# Patient Record
Sex: Female | Born: 1980 | Race: White | Hispanic: No | Marital: Married | Smoking: Former smoker
Health system: Southern US, Community
[De-identification: ages and names within clinical notes are randomized; demographics above are authoritative.]

## PROBLEM LIST (undated history)

## (undated) DIAGNOSIS — B373 Candidiasis of vulva and vagina: Secondary | ICD-10-CM

## (undated) DIAGNOSIS — F32A Depression, unspecified: Secondary | ICD-10-CM

## (undated) DIAGNOSIS — F329 Major depressive disorder, single episode, unspecified: Secondary | ICD-10-CM

## (undated) DIAGNOSIS — B3731 Acute candidiasis of vulva and vagina: Secondary | ICD-10-CM

## (undated) DIAGNOSIS — I10 Essential (primary) hypertension: Secondary | ICD-10-CM

## (undated) DIAGNOSIS — F419 Anxiety disorder, unspecified: Secondary | ICD-10-CM

## (undated) DIAGNOSIS — Z8619 Personal history of other infectious and parasitic diseases: Secondary | ICD-10-CM

## (undated) DIAGNOSIS — K921 Melena: Secondary | ICD-10-CM

## (undated) HISTORY — DX: Personal history of other infectious and parasitic diseases: Z86.19

## (undated) HISTORY — DX: Candidiasis of vulva and vagina: B37.3

## (undated) HISTORY — DX: Acute candidiasis of vulva and vagina: B37.31

## (undated) HISTORY — DX: Essential (primary) hypertension: I10

## (undated) HISTORY — DX: Melena: K92.1

---
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
## 2003-02-09 ENCOUNTER — Encounter: Payer: Self-pay | Admitting: Emergency Medicine

## 2003-02-09 ENCOUNTER — Emergency Department (HOSPITAL_COMMUNITY): Admission: EM | Admit: 2003-02-09 | Discharge: 2003-02-09 | Payer: Self-pay | Admitting: Emergency Medicine

## 2006-08-27 ENCOUNTER — Ambulatory Visit (HOSPITAL_COMMUNITY): Admission: RE | Admit: 2006-08-27 | Discharge: 2006-08-27 | Payer: Self-pay | Admitting: Internal Medicine

## 2006-09-17 ENCOUNTER — Ambulatory Visit (HOSPITAL_COMMUNITY): Admission: RE | Admit: 2006-09-17 | Discharge: 2006-09-17 | Payer: Self-pay | Admitting: Internal Medicine

## 2010-09-10 ENCOUNTER — Encounter: Payer: Self-pay | Admitting: Internal Medicine

## 2010-11-19 DIAGNOSIS — K921 Melena: Secondary | ICD-10-CM

## 2010-11-19 HISTORY — DX: Melena: K92.1

## 2011-12-05 ENCOUNTER — Ambulatory Visit: Payer: Self-pay | Admitting: Obstetrics and Gynecology

## 2011-12-17 ENCOUNTER — Telehealth: Payer: Self-pay

## 2011-12-17 NOTE — Telephone Encounter (Signed)
Per protocol, Sprintec 28 day , sig: 1 po qd. 1 RF called to pharmacy til pt's AEX in May. Melody Comas A

## 2011-12-18 ENCOUNTER — Ambulatory Visit: Payer: Self-pay | Admitting: Obstetrics and Gynecology

## 2012-01-09 ENCOUNTER — Ambulatory Visit: Payer: Self-pay | Admitting: Obstetrics and Gynecology

## 2012-01-15 ENCOUNTER — Other Ambulatory Visit: Payer: Self-pay

## 2012-01-15 DIAGNOSIS — Z309 Encounter for contraceptive management, unspecified: Secondary | ICD-10-CM

## 2012-01-15 NOTE — Telephone Encounter (Signed)
Faxed in a refill on sprintec 28 day tabs sig 1 po qd  Disp. 1 pk only until after AEX in June 2013. Melody Comas A

## 2012-01-23 MED ORDER — NORGESTIMATE-ETH ESTRADIOL 0.25-35 MG-MCG PO TABS: 1.0000 | ORAL_TABLET | Freq: Every day | ORAL | Status: DC

## 2012-01-29 ENCOUNTER — Ambulatory Visit (INDEPENDENT_AMBULATORY_CARE_PROVIDER_SITE_OTHER): Payer: BC Managed Care – PPO | Admitting: Obstetrics and Gynecology

## 2012-01-29 ENCOUNTER — Encounter: Payer: Self-pay | Admitting: Obstetrics and Gynecology

## 2012-01-29 VITALS — BP 134/84 | Ht 64.0 in | Wt 138.0 lb

## 2012-01-29 DIAGNOSIS — Z01419 Encounter for gynecological examination (general) (routine) without abnormal findings: Secondary | ICD-10-CM

## 2012-01-29 DIAGNOSIS — Z124 Encounter for screening for malignant neoplasm of cervix: Secondary | ICD-10-CM

## 2012-01-29 MED ORDER — NORGESTIMATE-ETH ESTRADIOL 0.25-35 MG-MCG PO TABS: 1.0000 | ORAL_TABLET | Freq: Every day | ORAL | Status: DC

## 2012-01-29 NOTE — Progress Notes (Signed)
Contraception Sprintec Last pap 11/28/2010 wnl Last Mammo none Last Colonoscopy none Last Dexa Scan none Primary MD Robley Fries Abuse at Home none  No complaints.  Pt does not report any probs with stools.  Filed Vitals:   01/29/12 1458  BP: 134/84   ROS: noncontributory  Physical Examination: General appearance - alert, well appearing, and in no distress Neck - supple, no significant adenopathy Chest - clear to auscultation, no wheezes, rales or rhonchi, symmetric air entry Heart - normal rate and regular rhythm Abdomen - soft, nontender, nondistended, no masses or organomegaly Breasts - breasts appear normal, no suspicious masses, no skin or nipple changes or axillary nodes Pelvic - normal external genitalia, vulva, vagina, cervix, uterus and adnexa Back exam - no CVAT Extremities - no edema, redness or tenderness in the calves or thighs  A/P Refill Sprintec per pt request Pap  RTO for AEX

## 2012-01-29 NOTE — Progress Notes (Signed)
Addended by: Osborn Coho on: 01/29/2012 03:53 PM   Modules accepted: Orders

## 2012-01-30 LAB — PAP IG W/ RFLX HPV ASCU

## 2016-03-29 LAB — OB RESULTS CONSOLE HIV ANTIBODY (ROUTINE TESTING): HIV: NONREACTIVE

## 2016-03-29 LAB — OB RESULTS CONSOLE RPR: RPR: NONREACTIVE

## 2016-03-29 LAB — OB RESULTS CONSOLE HEPATITIS B SURFACE ANTIGEN: Hepatitis B Surface Ag: NEGATIVE

## 2016-03-29 LAB — OB RESULTS CONSOLE ABO/RH: RH Type: POSITIVE

## 2016-03-29 LAB — OB RESULTS CONSOLE ANTIBODY SCREEN: Antibody Screen: NEGATIVE

## 2016-03-29 LAB — OB RESULTS CONSOLE GC/CHLAMYDIA
Chlamydia: NEGATIVE
Gonorrhea: NEGATIVE

## 2016-03-29 LAB — OB RESULTS CONSOLE RUBELLA ANTIBODY, IGM: Rubella: IMMUNE

## 2016-08-20 NOTE — L&D Delivery Note (Signed)
Delivery Note At 3:27 AM a viable female was delivered via Vaginal, Spontaneous Delivery (Presentation: vtx; LOA ).  APGAR: 9, 9; weight  pending.   Placenta status: spontaneous, intact.  Cord:  with the following complications: none.   Anesthesia:  Local Episiotomy: Median Lacerations: None Suture Repair: 3.0 vicryl rapide Est. Blood Loss (mL):  200  Mom to postpartum.  Baby to Couplet care / Skin to Skin.  Ann Parks D 10/16/2016, 4:15 AM

## 2016-09-19 LAB — OB RESULTS CONSOLE GBS: GBS: NEGATIVE

## 2016-10-15 ENCOUNTER — Encounter (HOSPITAL_COMMUNITY): Payer: Self-pay

## 2016-10-15 ENCOUNTER — Inpatient Hospital Stay (HOSPITAL_COMMUNITY)
Admission: AD | Admit: 2016-10-15 | Discharge: 2016-10-18 | DRG: 774 | Disposition: A | Discharge status: Home / Self Care | Payer: BLUE CROSS/BLUE SHIELD | Source: Ambulatory Visit | Attending: Obstetrics and Gynecology | Admitting: Obstetrics and Gynecology

## 2016-10-15 DIAGNOSIS — O4292 Full-term premature rupture of membranes, unspecified as to length of time between rupture and onset of labor: Secondary | ICD-10-CM | POA: Diagnosis present

## 2016-10-15 DIAGNOSIS — O99344 Other mental disorders complicating childbirth: Secondary | ICD-10-CM | POA: Diagnosis present

## 2016-10-15 DIAGNOSIS — O1002 Pre-existing essential hypertension complicating childbirth: Principal | ICD-10-CM | POA: Diagnosis present

## 2016-10-15 DIAGNOSIS — F329 Major depressive disorder, single episode, unspecified: Secondary | ICD-10-CM | POA: Diagnosis present

## 2016-10-15 DIAGNOSIS — Z3A39 39 weeks gestation of pregnancy: Secondary | ICD-10-CM

## 2016-10-15 DIAGNOSIS — Z87891 Personal history of nicotine dependence: Secondary | ICD-10-CM

## 2016-10-15 DIAGNOSIS — O4290 Premature rupture of membranes, unspecified as to length of time between rupture and onset of labor, unspecified weeks of gestation: Secondary | ICD-10-CM | POA: Diagnosis present

## 2016-10-15 NOTE — MAU Note (Signed)
Pt presents complaining of contractions every 3-5 minutes for the last 3 hours. Some bleeding. No leaking of fluid. Reports good fetal movement. 4cm in office and membranes stripped.

## 2016-10-16 ENCOUNTER — Encounter (HOSPITAL_COMMUNITY): Payer: Self-pay

## 2016-10-16 DIAGNOSIS — O4290 Premature rupture of membranes, unspecified as to length of time between rupture and onset of labor, unspecified weeks of gestation: Secondary | ICD-10-CM | POA: Diagnosis present

## 2016-10-16 DIAGNOSIS — F329 Major depressive disorder, single episode, unspecified: Secondary | ICD-10-CM | POA: Diagnosis present

## 2016-10-16 DIAGNOSIS — O4292 Full-term premature rupture of membranes, unspecified as to length of time between rupture and onset of labor: Secondary | ICD-10-CM | POA: Diagnosis present

## 2016-10-16 DIAGNOSIS — Z87891 Personal history of nicotine dependence: Secondary | ICD-10-CM | POA: Diagnosis not present

## 2016-10-16 DIAGNOSIS — O99344 Other mental disorders complicating childbirth: Secondary | ICD-10-CM | POA: Diagnosis present

## 2016-10-16 DIAGNOSIS — Z3A39 39 weeks gestation of pregnancy: Secondary | ICD-10-CM | POA: Diagnosis not present

## 2016-10-16 DIAGNOSIS — O42913 Preterm premature rupture of membranes, unspecified as to length of time between rupture and onset of labor, third trimester: Secondary | ICD-10-CM

## 2016-10-16 DIAGNOSIS — O1002 Pre-existing essential hypertension complicating childbirth: Secondary | ICD-10-CM | POA: Diagnosis present

## 2016-10-16 DIAGNOSIS — Z3493 Encounter for supervision of normal pregnancy, unspecified, third trimester: Secondary | ICD-10-CM | POA: Diagnosis present

## 2016-10-16 LAB — COMPREHENSIVE METABOLIC PANEL
ALT: 14 U/L (ref 14–54)
AST: 22 U/L (ref 15–41)
Albumin: 3 g/dL — ABNORMAL LOW (ref 3.5–5.0)
Alkaline Phosphatase: 158 U/L — ABNORMAL HIGH (ref 38–126)
Anion gap: 7 (ref 5–15)
BUN: 9 mg/dL (ref 6–20)
CO2: 23 mmol/L (ref 22–32)
Calcium: 9.3 mg/dL (ref 8.9–10.3)
Chloride: 105 mmol/L (ref 101–111)
Creatinine, Ser: 0.73 mg/dL (ref 0.44–1.00)
GFR calc Af Amer: >60 mL/min (ref >60)
GFR calc non Af Amer: >60 mL/min (ref >60)
Glucose, Bld: 89 mg/dL (ref 65–99)
Potassium: 3.8 mmol/L (ref 3.5–5.1)
Sodium: 135 mmol/L (ref 135–145)
Total Bilirubin: 0.2 mg/dL — ABNORMAL LOW (ref 0.3–1.2)
Total Protein: 6.4 g/dL — ABNORMAL LOW (ref 6.5–8.1)

## 2016-10-16 LAB — CBC
HCT: 34.6 % — ABNORMAL LOW (ref 36.0–46.0)
Hemoglobin: 11.8 g/dL — ABNORMAL LOW (ref 12.0–15.0)
MCH: 30.4 pg (ref 26.0–34.0)
MCHC: 34.1 g/dL (ref 30.0–36.0)
MCV: 89.2 fL (ref 78.0–100.0)
Platelets: 186 10*3/uL (ref 150–400)
RBC: 3.88 MIL/uL (ref 3.87–5.11)
RDW: 14.3 % (ref 11.5–15.5)
WBC: 16.6 10*3/uL — ABNORMAL HIGH (ref 4.0–10.5)

## 2016-10-16 LAB — TYPE AND SCREEN
ABO/RH(D): A POS
Antibody Screen: NEGATIVE

## 2016-10-16 LAB — ABO/RH: ABO/RH(D): A POS

## 2016-10-16 LAB — PROTEIN / CREATININE RATIO, URINE
Creatinine, Urine: 64 mg/dL
Total Protein, Urine: <6 mg/dL

## 2016-10-16 LAB — POCT FERN TEST: POCT Fern Test: POSITIVE

## 2016-10-16 MED ORDER — METHYLERGONOVINE MALEATE 0.2 MG/ML IJ SOLN: 0.2000 mg | INTRAMUSCULAR | Status: DC | PRN

## 2016-10-16 MED ORDER — OXYCODONE-ACETAMINOPHEN 5-325 MG PO TABS: 2.0000 | ORAL_TABLET | ORAL | Status: DC | PRN

## 2016-10-16 MED ORDER — LIDOCAINE HCL (PF) 1 % IJ SOLN
INTRAMUSCULAR | Status: AC
  Administered 2016-10-16: 30 mL via SUBCUTANEOUS
  Filled 2016-10-16: qty 30

## 2016-10-16 MED ORDER — OXYTOCIN BOLUS FROM INFUSION
500.0000 mL | Freq: Once | INTRAVENOUS | Status: AC
  Administered 2016-10-16: 500 mL via INTRAVENOUS

## 2016-10-16 MED ORDER — SOD CITRATE-CITRIC ACID 500-334 MG/5ML PO SOLN: 30.0000 mL | ORAL | Status: DC | PRN

## 2016-10-16 MED ORDER — LIDOCAINE HCL (PF) 1 % IJ SOLN
30.0000 mL | INTRAMUSCULAR | Status: AC | PRN
  Administered 2016-10-16: 30 mL via SUBCUTANEOUS
  Filled 2016-10-16: qty 30

## 2016-10-16 MED ORDER — DIBUCAINE 1 % RE OINT: 1.0000 "application " | TOPICAL_OINTMENT | RECTAL | Status: DC | PRN

## 2016-10-16 MED ORDER — ACETAMINOPHEN 325 MG PO TABS: 650.0000 mg | ORAL_TABLET | ORAL | Status: DC | PRN

## 2016-10-16 MED ORDER — PRENATAL MULTIVITAMIN CH
1.0000 | ORAL_TABLET | Freq: Every day | ORAL | Status: DC
  Administered 2016-10-16 – 2016-10-18 (×3): 1 via ORAL
  Filled 2016-10-16 (×3): qty 1

## 2016-10-16 MED ORDER — MAGNESIUM HYDROXIDE 400 MG/5ML PO SUSP: 30.0000 mL | ORAL | Status: DC | PRN

## 2016-10-16 MED ORDER — LACTATED RINGERS IV SOLN: 500.0000 mL | INTRAVENOUS | Status: DC | PRN

## 2016-10-16 MED ORDER — BUPROPION HCL 75 MG PO TABS
150.0000 mg | ORAL_TABLET | Freq: Every day | ORAL | Status: DC
  Administered 2016-10-16 – 2016-10-18 (×3): 150 mg via ORAL
  Filled 2016-10-16 (×4): qty 2

## 2016-10-16 MED ORDER — FENTANYL CITRATE (PF) 100 MCG/2ML IJ SOLN: 100.0000 ug | INTRAMUSCULAR | Status: DC | PRN

## 2016-10-16 MED ORDER — ACETAMINOPHEN 325 MG PO TABS
650.0000 mg | ORAL_TABLET | ORAL | Status: DC | PRN
  Administered 2016-10-18: 650 mg via ORAL
  Filled 2016-10-16: qty 2

## 2016-10-16 MED ORDER — OXYCODONE HCL 5 MG PO TABS: 5.0000 mg | ORAL_TABLET | ORAL | Status: DC | PRN

## 2016-10-16 MED ORDER — OXYTOCIN BOLUS FROM INFUSION: 500.0000 mL | Freq: Once | INTRAVENOUS | Status: DC

## 2016-10-16 MED ORDER — ONDANSETRON HCL 4 MG/2ML IJ SOLN: 4.0000 mg | Freq: Four times a day (QID) | INTRAMUSCULAR | Status: DC | PRN

## 2016-10-16 MED ORDER — OXYCODONE-ACETAMINOPHEN 5-325 MG PO TABS: 1.0000 | ORAL_TABLET | ORAL | Status: DC | PRN

## 2016-10-16 MED ORDER — BENZOCAINE-MENTHOL 20-0.5 % EX AERO
1.0000 "application " | INHALATION_SPRAY | CUTANEOUS | Status: DC | PRN
  Administered 2016-10-16: 1 via TOPICAL
  Filled 2016-10-16: qty 56

## 2016-10-16 MED ORDER — METHYLERGONOVINE MALEATE 0.2 MG PO TABS: 0.2000 mg | ORAL_TABLET | ORAL | Status: DC | PRN

## 2016-10-16 MED ORDER — MEASLES, MUMPS & RUBELLA VAC ~~LOC~~ INJ
0.5000 mL | INJECTION | Freq: Once | SUBCUTANEOUS | Status: DC
  Filled 2016-10-16: qty 0.5

## 2016-10-16 MED ORDER — SIMETHICONE 80 MG PO CHEW: 80.0000 mg | CHEWABLE_TABLET | ORAL | Status: DC | PRN

## 2016-10-16 MED ORDER — COCONUT OIL OIL
1.0000 "application " | TOPICAL_OIL | Status: DC | PRN
  Administered 2016-10-16: 1 via TOPICAL
  Filled 2016-10-16: qty 120

## 2016-10-16 MED ORDER — DIPHENHYDRAMINE HCL 25 MG PO CAPS: 25.0000 mg | ORAL_CAPSULE | Freq: Four times a day (QID) | ORAL | Status: DC | PRN

## 2016-10-16 MED ORDER — ALPRAZOLAM 0.5 MG PO TABS: 0.5000 mg | ORAL_TABLET | Freq: Every evening | ORAL | Status: DC | PRN

## 2016-10-16 MED ORDER — OXYTOCIN 40 UNITS IN LACTATED RINGERS INFUSION - SIMPLE MED: 2.5000 [IU]/h | INTRAVENOUS | Status: DC

## 2016-10-16 MED ORDER — TETANUS-DIPHTH-ACELL PERTUSSIS 5-2.5-18.5 LF-MCG/0.5 IM SUSP: 0.5000 mL | Freq: Once | INTRAMUSCULAR | Status: DC

## 2016-10-16 MED ORDER — BUPROPION HCL 100 MG PO TABS
100.0000 mg | ORAL_TABLET | Freq: Two times a day (BID) | ORAL | Status: DC
  Filled 2016-10-16: qty 1

## 2016-10-16 MED ORDER — FLEET ENEMA 7-19 GM/118ML RE ENEM: 1.0000 | ENEMA | RECTAL | Status: DC | PRN

## 2016-10-16 MED ORDER — IBUPROFEN 600 MG PO TABS
600.0000 mg | ORAL_TABLET | Freq: Four times a day (QID) | ORAL | Status: DC
  Administered 2016-10-16 – 2016-10-18 (×10): 600 mg via ORAL
  Filled 2016-10-16 (×10): qty 1

## 2016-10-16 MED ORDER — SENNOSIDES-DOCUSATE SODIUM 8.6-50 MG PO TABS
2.0000 | ORAL_TABLET | ORAL | Status: DC
  Administered 2016-10-16 – 2016-10-17 (×2): 2 via ORAL
  Filled 2016-10-16 (×2): qty 2

## 2016-10-16 MED ORDER — WITCH HAZEL-GLYCERIN EX PADS: 1.0000 "application " | MEDICATED_PAD | CUTANEOUS | Status: DC | PRN

## 2016-10-16 MED ORDER — LIDOCAINE HCL (PF) 1 % IJ SOLN: 30.0000 mL | INTRAMUSCULAR | Status: DC | PRN

## 2016-10-16 MED ORDER — OXYCODONE HCL 5 MG PO TABS: 10.0000 mg | ORAL_TABLET | ORAL | Status: DC | PRN

## 2016-10-16 MED ORDER — OXYTOCIN 40 UNITS IN LACTATED RINGERS INFUSION - SIMPLE MED
INTRAVENOUS | Status: AC
  Filled 2016-10-16: qty 1000

## 2016-10-16 MED ORDER — ONDANSETRON HCL 4 MG PO TABS: 4.0000 mg | ORAL_TABLET | ORAL | Status: DC | PRN

## 2016-10-16 MED ORDER — LACTATED RINGERS IV SOLN
INTRAVENOUS | Status: DC
  Administered 2016-10-16: 03:00:00 via INTRAVENOUS

## 2016-10-16 MED ORDER — ONDANSETRON HCL 4 MG/2ML IJ SOLN: 4.0000 mg | INTRAMUSCULAR | Status: DC | PRN

## 2016-10-16 MED ORDER — ZOLPIDEM TARTRATE 5 MG PO TABS: 5.0000 mg | ORAL_TABLET | Freq: Every evening | ORAL | Status: DC | PRN

## 2016-10-16 MED ORDER — LACTATED RINGERS IV SOLN: INTRAVENOUS | Status: DC

## 2016-10-16 NOTE — Lactation Note (Signed)
This note was copied from a baby's chart. Lactation Consultation Note  Patient Name: Girl Hardie PulleyKelley Kuipers Today's Date: 10/16/2016 Reason for consult: Initial assessment;Breast/nipple pain (See LC plan for steps for latching due to soreness )  Baby is 11 hours old and per mom already has a tiny blood blister on the right nipple / and on the left ( no blister noted ), just bruising on the areola,  @ consult LC changed a mec. Smear and while changing baby had a large wet.  LC assessed breast tissue with moms permission and noted a tiny intact blister on the right nipple , and on the left bruising on the areola. Mom already has coconut oil from the RN.  LC noted the areola to be semi compressible, easily expressed milk both breast. LC encouraged mom to use the EBM 1st , and then the coconut oil.  LC assisted with latch on the right and left breast/ football position , and worked on depth . Baby opened wide for both breast and extends her tongue well, multiple swallows noted, increased with breast compressions. Both nipples well rounded when baby was released. Mom able to breast feed both breast with assistance and per mom the right breat latch more comfortable than the left breast . alittle pinching on both , breast compressions and firm support helps. Baby fed 1st breast 7 mins , 2nd breast 10 mins. Depth achieved both breast. Mother informed of post-discharge support and given phone number to the lactation department, including services for phone call assistance; out-patient appointments; and breastfeeding support group. List of other breastfeeding resources in the community given in the handout. Encouraged mother to call for problems or concerns related to breastfeeding.  LC Plan - to prevent sore nipples increasing -  Prior to latching - breast massage, hand express, pre - pump to make the nipple / areola more elastic , and then reverse pressure, latch with breast compressions, until swallows and then  intermittent.  LC recommended following this until it soreness improves.    Maternal Data Has patient been taught Hand Expression?: Yes (several large drops of colostrum both breast ) Does the patient have breastfeeding experience prior to this delivery?: No  Feeding Feeding Type: Breast Fed Length of feed: 10 min (increased swallows )  LATCH Score/Interventions Latch: Grasps breast easily, tongue down, lips flanged, rhythmical sucking. Intervention(s): Adjust position;Assist with latch;Breast massage;Breast compression  Audible Swallowing: Spontaneous and intermittent  Type of Nipple: Everted at rest and after stimulation  Comfort (Breast/Nipple): Filling, red/small blisters or bruises, mild/mod discomfort  Problem noted: Cracked, bleeding, blisters, bruises;Mild/Moderate discomfort  Hold (Positioning): Assistance needed to correctly position infant at breast and maintain latch. Intervention(s): Breastfeeding basics reviewed;Support Pillows;Position options;Skin to skin  LATCH Score: 8  Lactation Tools Discussed/Used Tools: Shells;Pump Shell Type: Inverted Breast pump type: Manual Pump Review: Setup, frequency, and cleaning Initiated by:: MAI  Date initiated:: 10/16/16   Consult Status Consult Status: Follow-up Date: 10/17/16 Follow-up type: In-patient    Matilde SprangMargaret Ann Varnika Butz 10/16/2016, 3:05 PM

## 2016-10-16 NOTE — Progress Notes (Signed)
Patient ID: [name], female   DOB: 1981-07-08, 36 y.o.   MRN: [id number] Pt is doing well. She has some expected vaginal soreness but tolerable with ice and ibuprofen. She reports moderate lochia. Bonding well with baby; breastfeeding, skin to skin. No complaints at this time VSS 126-134/76-82 ABD- FF EXT - no Homans  A/P: PPD#0 s/p svd - stable         BP stable off meds (has been on toprol for years; ?                    Dose) - rec do not resume at this time         Continue wellbutrin 150mg  SR daily         Routine pp care

## 2016-10-16 NOTE — H&P (Addendum)
Ann Parks is a 36 y.o. female, G1P0, EGA 39+ weeks with EDC 3-1 presenting for eval of ctx.  On eval in MAU, VE 3-4 cm with reg ctx, BP slightly elevated with normal labs.  While waiting to recheck cervix, had ROM clear fluid.  She rapidly progressed in MAU and was taken to L&D.  [facility], normal growth u/s at 32 weeks and BP remained well controlled.  Also on Wellbutrin for depression.  OB History    Gravida Para Term Preterm AB Living   1             SAB TAB Ectopic Multiple Live Births                 Past Medical History:  Diagnosis Date  . Hematochezia 11/2010  . History of chicken pox   . Hypertension   . Vaginal yeast infection    History reviewed. No pertinent surgical history. Family History: family history includes Depression in her mother; Fibromyalgia in her mother; Hypertension in her father. Social History:  reports that she quit smoking about 13 years ago. Her smoking use included Cigarettes. She has never used smokeless tobacco. She reports that she drinks alcohol. She reports that she does not use drugs.     Maternal Diabetes: No Genetic Screening: Normal Maternal Ultrasounds/Referrals: Normal Fetal Ultrasounds or other Referrals:  None Maternal Substance Abuse:  No Significant Maternal Medications:  Meds include: Other:  Significant Maternal Lab Results:  Lab values include: Group B Strep negative Other Comments:  on Wellbutrin for depression, on Toprol for CHTN  Review of Systems  Respiratory: Negative.   Cardiovascular: Negative.    Maternal Medical History:  Reason for admission: Rupture of membranes and contractions.   Contractions: Frequency: regular.   Perceived severity is strong.    Fetal activity: Perceived fetal activity is normal.    Prenatal complications: no prenatal complications Prenatal Complications - Diabetes: none.    Dilation: 10 Effacement (%): 100 Station: +3 Exam by:: S  moyer Blood pressure (!) 152/91, pulse 83, temperature 98 F (36.7 C), temperature source Oral, resp. rate 18, height 5\' 4"  (1.626 m), weight 75.3 kg (166 lb). Maternal Exam:  Uterine Assessment: Contraction strength is moderate.  Contraction frequency is regular.   Abdomen: Patient reports no abdominal tenderness. Estimated fetal weight is 7 lbs.   Fetal presentation: vertex  Introitus: Normal vulva. Normal vagina.  Ferning test: positive.  Amniotic fluid character: clear.  Pelvis: adequate for delivery.   Cervix: Cervix evaluated by digital exam.     Physical Exam  Vitals reviewed. Constitutional: She appears well-developed and well-nourished.  Cardiovascular: Normal rate and regular rhythm.   Respiratory: Effort normal. No respiratory distress.  GI: Soft.    Prenatal labs: ABO, Rh: --/--/A POS (02/27 0023) Antibody: NEG (02/27 0023) Rubella: Immune (08/10 0000) RPR: Nonreactive (08/10 0000)  HBsAg: Negative (08/10 0000)  HIV: Non-reactive (08/10 0000)  GBS: Negative (01/31 0000)   Assessment/Plan: IUP at 39+ weeks admitted with rapid labor after SROM, CHTN controlled with Toprol during pregnancy-BP slightly elevated on admission.  See delivery note.   Magon Croson D 10/16/2016, 4:08 AM

## 2016-10-16 NOTE — MAU Provider Note (Signed)
Chief Complaint:  Contractions   First Provider Initiated Contact with Patient 10/16/16 0012     HPI: Ann Parks is a 36 y.o. G1P0 at 68w5dwho presents to maternity admissions reporting uterine contractions for several hours with some bloody show.  She reports good fetal movement, denies LOF, vaginal bleeding, vaginal itching/burning, urinary symptoms, h/a, dizziness, n/v, diarrhea, constipation or fever/chills.  She denies headache, visual changes or RUQ abdominal pain.  Noted to have hypertension on admission vital signs.  States had headache earlier but it went away.  Hypertension  This is a new problem. The current episode started today. Associated symptoms include headaches (had one earlier but it went away). Pertinent negatives include no anxiety, blurred vision, chest pain, palpitations, peripheral edema or shortness of breath. There are no associated agents to hypertension. Past treatments include nothing. There are no compliance problems.    RN Note: Pt presents complaining of contractions every 3-5 minutes for the last 3 hours. Some bleeding. No leaking of fluid. Reports good fetal movement. 4cm in office and membranes stripped.   Past Medical History: Past Medical History:  Diagnosis Date  . Hematochezia 11/2010  . History of chicken pox   . Hypertension   . Vaginal yeast infection     Past obstetric history: OB History  Gravida Para Term Preterm AB Living  1            SAB TAB Ectopic Multiple Live Births               # Outcome Date GA Lbr Len/2nd Weight Sex Delivery Anes PTL Lv  1 Current               Past Surgical History: History reviewed. No pertinent surgical history.  Family History: Family History  Problem Relation Age of Onset  . Depression Mother   . Fibromyalgia Mother   . Hypertension Father     Social History: Social History  Substance Use Topics  . Smoking status: Former Smoker    Types: Cigarettes    Quit date: 01/29/2003  . Smokeless  tobacco: Never Used  . Alcohol use Yes    Allergies:  Allergies  Allergen Reactions  . Penicillins     Meds:  Prescriptions Prior to Admission  Medication Sig Dispense Refill Last Dose  . calcium carbonate (TUMS - DOSED IN MG ELEMENTAL CALCIUM) 500 MG chewable tablet Chew 1 tablet by mouth daily.   10/15/2016 at Unknown time  . ALPRAZolam (XANAX) 0.25 MG tablet Take 0.5 mg by mouth at bedtime as needed.   Taking  . amLODipine (NORVASC) 10 MG tablet Take 10 mg by mouth daily.   Taking  . buPROPion (WELLBUTRIN) 100 MG tablet Take by mouth 2 (two) times daily.   Taking  . dexamethasone (DECADRON) 0.1 % ophthalmic suspension 2 (two) times daily.   Not Taking  . norgestimate-ethinyl estradiol (ORTHO-CYCLEN,SPRINTEC,PREVIFEM) 0.25-35 MG-MCG tablet Take 1 tablet by mouth daily. 3 Package 4   . triamterene-hydrochlorothiazide (MAXZIDE-25) 37.5-25 MG per tablet Take 1 tablet by mouth daily.   Taking    I have reviewed patient's Past Medical Hx, Surgical Hx, Family Hx, Social Hx, medications and allergies.   ROS:  Review of Systems  Eyes: Negative for blurred vision.  Respiratory: Negative for shortness of breath.   Cardiovascular: Negative for chest pain and palpitations.  Neurological: Positive for headaches (had one earlier but it went away).   Other systems negative  Physical Exam  Patient Vitals for the past 24  hrs:  BP Temp Temp src Pulse Resp  10/15/16 2349 133/97 - - 78 -  10/15/16 2334 131/95 - - 79 -  10/15/16 2331 (!) 135/102 - - 83 -  10/15/16 2328 (!) 147/102 97.9 F (36.6 C) Oral 84 18   Vitals:   10/16/16 0034 10/16/16 0049 10/16/16 0119 10/16/16 0150  BP: 127/85 120/85 129/89 107/82  Pulse: 74 79 76 96  Resp:      Temp:      TempSrc:        Constitutional: Well-developed, well-nourished female in no acute distress.  Cardiovascular: normal rate and rhythm Respiratory: normal effort, clear to auscultation bilaterally GI: Abd soft, non-tender, gravid  appropriate for gestational age.   No rebound or guarding. MS: Extremities nontender, no edema, normal ROM Neurologic: Alert and oriented x 4. DTRs 2+ with no clonus. GU: Neg CVAT.  Dilation: 3 Effacement (%): 90 Station: -2 Presentation: Vertex Exam by:: Camelia Eng RN After exam, patient noted to be leaking, and exam was + for ROM  FHT:  Baseline 140 , moderate variability, accelerations present, no decelerations Contractions:   Irregular     Labs: Results for orders placed or performed during the hospital encounter of 10/15/16 (from the past 24 hour(s))  CBC     Status: Abnormal   Collection Time: 10/16/16 12:23 AM  Result Value Ref Range   WBC 16.6 (H) 4.0 - 10.5 K/uL   RBC 3.88 3.87 - 5.11 MIL/uL   Hemoglobin 11.8 (L) 12.0 - 15.0 g/dL   HCT 24.4 (L) 01.0 - 27.2 %   MCV 89.2 78.0 - 100.0 fL   MCH 30.4 26.0 - 34.0 pg   MCHC 34.1 30.0 - 36.0 g/dL   RDW 53.6 64.4 - 03.4 %   Platelets 186 150 - 400 K/uL  Comprehensive metabolic panel     Status: Abnormal   Collection Time: 10/16/16 12:23 AM  Result Value Ref Range   Sodium 135 135 - 145 mmol/L   Potassium 3.8 3.5 - 5.1 mmol/L   Chloride 105 101 - 111 mmol/L   CO2 23 22 - 32 mmol/L   Glucose, Bld 89 65 - 99 mg/dL   BUN 9 6 - 20 mg/dL   Creatinine, Ser 7.42 0.44 - 1.00 mg/dL   Calcium 9.3 8.9 - 59.5 mg/dL   Total Protein 6.4 (L) 6.5 - 8.1 g/dL   Albumin 3.0 (L) 3.5 - 5.0 g/dL   AST 22 15 - 41 U/L   ALT 14 14 - 54 U/L   Alkaline Phosphatase 158 (H) 38 - 126 U/L   Total Bilirubin 0.2 (L) 0.3 - 1.2 mg/dL   GFR calc non Af Amer >60 >60 mL/min   GFR calc Af Amer >60 >60 mL/min   Anion gap 7 5 - 15  Protein / creatinine ratio, urine     Status: None   Collection Time: 10/16/16  1:00 AM  Result Value Ref Range   Creatinine, Urine 64.00 mg/dL   Total Protein, Urine <6 mg/dL   Protein Creatinine Ratio        0.00 - 0.15 mg/mg[Cre]  Fern Test     Status: None   Collection Time: 10/16/16  1:42 AM  Result Value Ref  Range   POCT Fern Test Positive = ruptured amniotic membanes     Imaging:  No results found.  MAU Course/MDM: I have ordered labs and reviewed results. Preeclampsia labs sent NST reviewed Consult Dr Jackelyn Knife by RN  with presentation, exam  findings and test results.    Assessment: SIUP at 7563w5d PROM at term  Gestational hypertension without evidence of preeclampsia  [facility] Jackelyn KnifeMeisinger   Wynelle BourgeoisMarie Williams CNM, MSN Certified Nurse-Midwife 10/16/2016 12:13 AM

## 2016-10-16 NOTE — Plan of Care (Signed)
Problem: Activity: Goal: Ability to tolerate increased activity will improve Outcome: Completed/Met Date Met: 10/16/16 Pt ambulating independently in the room and without difficulty.   Problem: Urinary Elimination: Goal: Ability to reestablish a normal urinary elimination pattern will improve Outcome: Completed/Met Date Met: 10/16/16 Pt voiding without difficulty

## 2016-10-16 NOTE — MAU Note (Signed)
Spoke with Dr Jackelyn KnifeMeisinger about patient status. Pt SROM at 0135-pink fluid, SVE 3/90/-2, BP and lab results reviewed. Routine admission and epidural orders given.

## 2016-10-17 LAB — RPR: RPR Ser Ql: NONREACTIVE

## 2016-10-17 NOTE — Lactation Note (Signed)
This note was copied from a baby's chart. Lactation Consultation Note  Patient Name: Ann Parks ZOXWR'UToday's Date: 10/17/2016 Reason for consult: Follow-up assessment  Infant sleeping in bassinet. Mom reports that she finished feeding recently. Mom states that she thinks infant is doing better w/latching now that she is opening her mouth wider.   Mom has my # to call when ready for consult, although she reports that she is expecting visitors soon. Mom also provided phone # for evening LC.   Mom on Wellbutrin 150mg  qd (L3). Ann Parks, Ann Parks 10/17/2016, 2:06 PM

## 2016-10-17 NOTE — Progress Notes (Signed)
Post Partum Day 1 Subjective: no complaints, up ad lib, voiding, tolerating PO and nl lochia, pain controlled  Objective: Blood pressure 121/73, pulse 67, temperature 98.3 F (36.8 C), temperature source Oral, resp. rate 18, height 5\' 4"  (1.626 m), weight 75.3 kg (166 lb), SpO2 98 %, unknown if currently breastfeeding.  Physical Exam:  General: alert and no distress Lochia: appropriate Uterine Fundus: firm   Recent Labs  10/16/16 0023  HGB 11.8*  HCT 34.6*    Assessment/Plan: Plan for discharge tomorrow, Breastfeeding and Lactation consult.  Routine PP care.     LOS: 1 day   Bovard-Stuckert, Trevaris Pennella 10/17/2016, 9:45 AM

## 2016-10-17 NOTE — Plan of Care (Signed)
Problem: Nutritional: Goal: Mothers verbalization of comfort with breastfeeding process will improve Outcome: Completed/Met Date Met: 10/17/16 Pt reports nipple soreness.  RN noted bruising on nipples.  Pt using hand pump to erect nipple, then she hand expresses and then latches baby to the breast.  Pt waits for baby to open mouth wide and then puts baby on with good depth.  Frequent swallows heard.  Pt following breastfeeding with coconut oil.

## 2016-10-18 MED ORDER — IBUPROFEN 600 MG PO TABS: 600.0000 mg | ORAL_TABLET | Freq: Four times a day (QID) | ORAL | 0 refills | Status: DC

## 2016-10-18 NOTE — Progress Notes (Signed)
Post Partum Day 2 Subjective: no complaints, up ad lib and tolerating PO  Objective: Blood pressure 123/82, pulse 70, temperature 98 F (36.7 C), temperature source Oral, resp. rate 18, height 5\' 4"  (1.626 m), weight 75.3 kg (166 lb), SpO2 98 %, unknown if currently breastfeeding.  Physical Exam:  General: alert and cooperative Lochia: appropriate Uterine Fundus: firm    Recent Labs  10/16/16 0023  HGB 11.8*  HCT 34.6*    Assessment/Plan: Discharge home   LOS: 2 days   Hiroyuki Ozanich W 10/18/2016, 9:20 AM

## 2016-10-18 NOTE — Discharge Summary (Signed)
OB Discharge Summary     Patient Name: Ann Parks DOB: 1981/07/16 MRN: [id number]  Date of admission: 10/15/2016 Delivering MD: Jackelyn KnifeMEISINGER, TODD   Date of discharge: 10/18/2016  Admitting diagnosis: 39 WEEKS CTX Intrauterine pregnancy: 4865w5d     Secondary diagnosis:  Active Problems:   Indication for care in labor or delivery   PROM (premature rupture of membranes)   SVD (spontaneous vaginal delivery)  Additional problems: none     Discharge diagnosis: Term Pregnancy Delivered                                                                                                Post partum procedures:none  Augmentation: AROM  Complications: None  Hospital course:  Onset of Labor With Vaginal Delivery     36 y.o. yo G1P1001 at 8765w5d was admitted in Active Labor on 10/15/2016. Patient had an uncomplicated labor course as follows:  Membrane Rupture Time/Date: 1:35 AM ,10/16/2016   Intrapartum Procedures: Episiotomy: Median [2]                                         Lacerations:  None [1]  Patient had a delivery of a Viable infant. 10/16/2016  Information for the patient's newborn:  Karsten FellsMartin, Girl Donia [[id number]][[id number]]  Delivery Method: Vaginal, Spontaneous Delivery (Filed from Delivery Summary)    Pateint had an uncomplicated postpartum course.  She is ambulating, tolerating a regular diet, passing flatus, and urinating well. Patient is discharged home in stable condition on 10/18/16.   Physical exam  Vitals:   10/16/16 1034 10/17/16 0558 10/17/16 1751 10/18/16 0623  BP: 134/86 121/73 126/81 123/82  Pulse: 93 67 74 70  Resp: 16 18 18 18   Temp: 97.8 F (36.6 C) 98.3 F (36.8 C) 98.3 F (36.8 C) 98 F (36.7 C)  TempSrc: Oral Oral Oral Oral  SpO2: 98%     Weight:      Height:       General: alert and cooperative Lochia: appropriate Uterine Fundus: firm  Labs: Lab Results  Component Value Date   WBC 16.6 (H) 10/16/2016   HGB 11.8 (L) 10/16/2016   HCT 34.6 (L)  10/16/2016   MCV 89.2 10/16/2016   PLT 186 10/16/2016   CMP Latest Ref Rng & Units 10/16/2016  Glucose 65 - 99 mg/dL 89  BUN 6 - 20 mg/dL 9  Creatinine 1.320.44 - 4.401.00 mg/dL 1.020.73  Sodium 725135 - 366145 mmol/L 135  Potassium 3.5 - 5.1 mmol/L 3.8  Chloride 101 - 111 mmol/L 105  CO2 22 - 32 mmol/L 23  Calcium 8.9 - 10.3 mg/dL 9.3  Total Protein 6.5 - 8.1 g/dL 6.4(L)  Total Bilirubin 0.3 - 1.2 mg/dL 4.4(I0.2(L)  Alkaline Phos 38 - 126 U/L 158(H)  AST 15 - 41 U/L 22  ALT 14 - 54 U/L 14    Discharge instruction: per After Visit Summary and "Baby and Me Booklet".  After visit meds:  Allergies as of 10/18/2016  Reactions   Penicillins       Medication List    STOP taking these medications   calcium carbonate 500 MG chewable tablet Commonly known as:  TUMS - dosed in mg elemental calcium     TAKE these medications   buPROPion 150 MG 12 hr tablet Commonly known as:  WELLBUTRIN SR Take 150 mg by mouth 2 (two) times daily.   ibuprofen 600 MG tablet Commonly known as:  ADVIL,MOTRIN Take 1 tablet (600 mg total) by mouth every 6 (six) hours.   metoprolol succinate 25 MG 24 hr tablet Commonly known as:  TOPROL-XL Take 25 mg by mouth daily.   prenatal multivitamin Tabs tablet Take 1 tablet by mouth daily at 12 noon.       Diet: routine diet  Activity: Advance as tolerated. Pelvic rest for 6 weeks.   Outpatient follow up:6 weeks Follow up Appt:No future appointments. Follow up Visit:No Follow-up on file.  Postpartum contraception: Undecided  Newborn Data: Live born female  Birth Weight: 7 lb (3175 g) APGAR: 9, 9  Baby Feeding: Breast Disposition:home with mother   10/18/2016 Oliver Pila, MD

## 2016-10-18 NOTE — Lactation Note (Addendum)
This note was copied from a baby's chart. Lactation Consultation Note: Infant is 2053 hours old and is at 9 % weight loss. Infant has had 21 stools and 9 wets. Infant to void before discharge complete. Mother reports that infant is cluster feeding.  Observed mother independently latch infant. Observed good depth with frequent swallows.  Mother has scabbing and redness to both nipples. Mother reports that she has pain of #2 when breast feeding. Observed that infant has a wide gape when latched.  Mother encouraged to continue to cue feed infant. Informed mother of importance of cue base feeding infant. Advised mother to hand express before and after feeding.  Mother has a hand pump . Advised to post pump for 15 mins on each breast. Mother given curved tip syringe and foley cup for supplementing infant with ebm. Suggested that parents phone insurance company to inquire about an electric pump. Informed mother of 2 week pump rental.  Advised mother in treatment of engorgement. Mother informed of outpatient services . Mother states that she will phone as needed. Mother to follow up with Peds in am.  Patient Name: Ann Parks ZOXWR'UToday's Date: 10/18/2016 Reason for consult: Follow-up assessment   Maternal Data    Feeding Feeding Type: Breast Fed  LATCH Score/Interventions Latch: Grasps breast easily, tongue down, lips flanged, rhythmical sucking.  Audible Swallowing: Spontaneous and intermittent Intervention(s): Skin to skin  Type of Nipple: Everted at rest and after stimulation  Comfort (Breast/Nipple): Filling, red/small blisters or bruises, mild/mod discomfort  Problem noted: Cracked, bleeding, blisters, bruises Interventions  (Cracked/bleeding/bruising/blister): Hand pump  Hold (Positioning): No assistance needed to correctly position infant at breast. Intervention(s): Support Pillows  LATCH Score: 9  Lactation Tools Discussed/Used     Consult Status Consult Status:  Complete    Michel BickersKendrick, Gracin Mcpartland McCoy 10/18/2016, 10:08 AM

## 2018-08-20 NOTE — L&D Delivery Note (Signed)
Delivery Note Pt progressed quickly to complete dilation with strong urge to push.  She pushed about 10 minutes and  at 4:13 AM a healthy female was delivered via Vaginal, Spontaneous (Presentation:ROA; compound presentation of left arm ).  APGAR:9 ,9 ; weight pending .   Placenta status:delivered spontaneously, .  Cord:  with the following complications: nuchal x 1 reduced.   Anesthesia:  Local 1% lidocaine Episiotomy: None Lacerations: 2nd degree Suture Repair: 3.0 vicryl rapide Est. Blood Loss (mL):  136mL  Mom to postpartum.  Baby to Couplet care / Skin to Skin.  Logan Bores 03/15/2019, 4:41 AM

## 2019-03-14 ENCOUNTER — Other Ambulatory Visit: Payer: Self-pay | Admitting: Obstetrics and Gynecology

## 2019-03-14 NOTE — H&P (Deleted)
  The note originally documented on this encounter has been moved the the encounter in which it belongs.  

## 2019-03-14 NOTE — H&P (Signed)
Ann Parks is a 38 y.o. female G2P1001 at 35 1/7 weeks (EDD 03/14/19 by 12 week [place])  presenting for IOL at term with favorable cervix and prenatal care complicated by [facility] stable on metoprolol for most part with some slight trends up in pressures over last 2 weeks.  No PIH sx, and labs WNL.  She is AMA but had low risk panorama.  She is on wellbutrin for depression and stable.  Past OB Hx NSVD x 1 2018 7#   Past Medical History:  Diagnosis Date  . Hematochezia 11/2010  . History of chicken pox   . Hypertension   . Vaginal yeast infection    No past surgical history on file. Family History: family history includes Depression in her mother; Fibromyalgia in her mother; Hypertension in her father. Social History:  reports that she quit smoking about 16 years ago. Her smoking use included cigarettes. She has never used smokeless tobacco. She reports current alcohol use. She reports that she does not use drugs.     Maternal Diabetes: No Genetic Screening: Normal Maternal Ultrasounds/Referrals: Normal Fetal Ultrasounds or other Referrals:  None Maternal Substance Abuse:  No Significant Maternal Medications:  Meds include: Other: Metoprolol and wellbutrin Significant Maternal Lab Results:  None Other Comments:  None  Review of Systems  Constitutional: Negative for fever.  Eyes: Negative for blurred vision.  Gastrointestinal: Negative for abdominal pain.   Maternal Medical History:  Contractions: Frequency: irregular.   Perceived severity is mild.    Fetal activity: Perceived fetal activity is normal.    Prenatal complications: PIH.   AMA, depression  Prenatal Complications - Diabetes: none.      unknown if currently breastfeeding. Maternal Exam:  Uterine Assessment: Contraction strength is mild.  Contraction frequency is irregular.   Abdomen: Patient reports no abdominal tenderness. Fetal presentation: vertex  Introitus: Normal vulva. Normal vagina.    Physical Exam   Constitutional: She appears well-developed.  Cardiovascular: Normal rate and regular rhythm.  Respiratory: Effort normal.  GI: Soft.  Genitourinary:    Vulva and vagina normal.   Psychiatric: She has a normal mood and affect.    Prenatal labs: ABO, Rh:  A positive Antibody:  negative Rubella:  Immune RPR:  NR  HBsAg:   Neg HIV:   NR GBS:   Neg Panorama negative One hour GCT 96 Essential panel negative 2017  Assessment/Plan: Pt admitted for IOL at 40+ weeks with [facility], fairly stable.  Will check [place] labs on admission, have been negative despite slight trend up in BP.   Plan pitocin and AROM   Logan Bores 03/14/2019, 11:09 AM

## 2019-03-15 ENCOUNTER — Encounter (HOSPITAL_COMMUNITY): Payer: Self-pay

## 2019-03-15 ENCOUNTER — Inpatient Hospital Stay (HOSPITAL_COMMUNITY): Payer: Managed Care, Other (non HMO)

## 2019-03-15 ENCOUNTER — Inpatient Hospital Stay (HOSPITAL_COMMUNITY)
Admission: AD | Admit: 2019-03-15 | Discharge: 2019-03-16 | DRG: 807 | Disposition: A | Discharge status: Home / Self Care | Payer: Managed Care, Other (non HMO) | Attending: Obstetrics and Gynecology | Admitting: Obstetrics and Gynecology

## 2019-03-15 ENCOUNTER — Other Ambulatory Visit: Payer: Self-pay

## 2019-03-15 DIAGNOSIS — Z3A40 40 weeks gestation of pregnancy: Secondary | ICD-10-CM | POA: Diagnosis not present

## 2019-03-15 DIAGNOSIS — Z87891 Personal history of nicotine dependence: Secondary | ICD-10-CM

## 2019-03-15 DIAGNOSIS — F329 Major depressive disorder, single episode, unspecified: Secondary | ICD-10-CM | POA: Diagnosis present

## 2019-03-15 DIAGNOSIS — O1002 Pre-existing essential hypertension complicating childbirth: Secondary | ICD-10-CM | POA: Diagnosis present

## 2019-03-15 DIAGNOSIS — O10913 Unspecified pre-existing hypertension complicating pregnancy, third trimester: Secondary | ICD-10-CM | POA: Diagnosis present

## 2019-03-15 DIAGNOSIS — O326XX0 Maternal care for compound presentation, not applicable or unspecified: Secondary | ICD-10-CM | POA: Diagnosis present

## 2019-03-15 DIAGNOSIS — O99344 Other mental disorders complicating childbirth: Secondary | ICD-10-CM | POA: Diagnosis present

## 2019-03-15 DIAGNOSIS — O09529 Supervision of elderly multigravida, unspecified trimester: Secondary | ICD-10-CM

## 2019-03-15 DIAGNOSIS — Z1159 Encounter for screening for other viral diseases: Secondary | ICD-10-CM | POA: Diagnosis not present

## 2019-03-15 HISTORY — DX: Anxiety disorder, unspecified: F41.9

## 2019-03-15 HISTORY — DX: Depression, unspecified: F32.A

## 2019-03-15 LAB — COMPREHENSIVE METABOLIC PANEL WITH GFR
ALT: 13 U/L (ref 0–44)
AST: 21 U/L (ref 15–41)
Albumin: 2.9 g/dL — ABNORMAL LOW (ref 3.5–5.0)
Alkaline Phosphatase: 118 U/L (ref 38–126)
Anion gap: 9 (ref 5–15)
BUN: 13 mg/dL (ref 6–20)
CO2: 22 mmol/L (ref 22–32)
Calcium: 9.7 mg/dL (ref 8.9–10.3)
Chloride: 107 mmol/L (ref 98–111)
Creatinine, Ser: 0.85 mg/dL (ref 0.44–1.00)
GFR calc Af Amer: >60 mL/min
GFR calc non Af Amer: >60 mL/min
Glucose, Bld: 88 mg/dL (ref 70–99)
Potassium: 4 mmol/L (ref 3.5–5.1)
Sodium: 138 mmol/L (ref 135–145)
Total Bilirubin: 0.4 mg/dL (ref 0.3–1.2)
Total Protein: 6.2 g/dL — ABNORMAL LOW (ref 6.5–8.1)

## 2019-03-15 LAB — CBC
HCT: 40.6 % (ref 36.0–46.0)
HCT: 34.9 % — ABNORMAL LOW (ref 36.0–46.0)
Hemoglobin: 13.3 g/dL (ref 12.0–15.0)
Hemoglobin: 11.7 g/dL — ABNORMAL LOW (ref 12.0–15.0)
MCH: 29.7 pg (ref 26.0–34.0)
MCH: 29.8 pg (ref 26.0–34.0)
MCHC: 32.8 g/dL (ref 30.0–36.0)
MCHC: 33.5 g/dL (ref 30.0–36.0)
MCV: 90.6 fL (ref 80.0–100.0)
MCV: 89 fL (ref 80.0–100.0)
Platelets: 230 10*3/uL (ref 150–400)
Platelets: 220 10*3/uL (ref 150–400)
RBC: 4.48 MIL/uL (ref 3.87–5.11)
RBC: 3.92 MIL/uL (ref 3.87–5.11)
RDW: 13.5 % (ref 11.5–15.5)
RDW: 13.6 % (ref 11.5–15.5)
WBC: 12.7 10*3/uL — ABNORMAL HIGH (ref 4.0–10.5)
WBC: 22.5 10*3/uL — ABNORMAL HIGH (ref 4.0–10.5)
nRBC: 0 % (ref 0.0–0.2)
nRBC: 0 % (ref 0.0–0.2)

## 2019-03-15 LAB — TYPE AND SCREEN
ABO/RH(D): A POS
Antibody Screen: NEGATIVE

## 2019-03-15 LAB — SARS CORONAVIRUS 2 BY RT PCR (HOSPITAL ORDER, PERFORMED IN ~~LOC~~ HOSPITAL LAB): SARS Coronavirus 2: NEGATIVE

## 2019-03-15 LAB — SYPHILIS: RPR W/REFLEX TO RPR TITER AND TREPONEMAL ANTIBODIES, TRADITIONAL SCREENING AND DIAGNOSIS ALGORITHM: RPR Ser Ql: NONREACTIVE

## 2019-03-15 LAB — PROTEIN / CREATININE RATIO, URINE
Creatinine, Urine: 67.21 mg/dL
Total Protein, Urine: <6 mg/dL

## 2019-03-15 LAB — ABO/RH: ABO/RH(D): A POS

## 2019-03-15 MED ORDER — OXYCODONE-ACETAMINOPHEN 5-325 MG PO TABS: 1.0000 | ORAL_TABLET | ORAL | Status: DC | PRN

## 2019-03-15 MED ORDER — PRENATAL MULTIVITAMIN CH
1.0000 | ORAL_TABLET | Freq: Every day | ORAL | Status: DC
  Administered 2019-03-15: 1 via ORAL
  Filled 2019-03-15: qty 1

## 2019-03-15 MED ORDER — LACTATED RINGERS IV SOLN
INTRAVENOUS | Status: DC
  Administered 2019-03-15: 01:00:00 via INTRAVENOUS

## 2019-03-15 MED ORDER — TERBUTALINE SULFATE 1 MG/ML IJ SOLN: 0.2500 mg | Freq: Once | INTRAMUSCULAR | Status: DC | PRN

## 2019-03-15 MED ORDER — FENTANYL CITRATE (PF) 100 MCG/2ML IJ SOLN
50.0000 ug | INTRAMUSCULAR | Status: DC | PRN
  Administered 2019-03-15: 100 ug via INTRAVENOUS
  Filled 2019-03-15: qty 2

## 2019-03-15 MED ORDER — ZOLPIDEM TARTRATE 5 MG PO TABS: 5.0000 mg | ORAL_TABLET | Freq: Every evening | ORAL | Status: DC | PRN

## 2019-03-15 MED ORDER — FENTANYL-BUPIVACAINE-NACL 0.5-0.125-0.9 MG/250ML-% EP SOLN: 12.0000 mL/h | EPIDURAL | Status: DC | PRN

## 2019-03-15 MED ORDER — METOPROLOL SUCCINATE ER 25 MG PO TB24
25.0000 mg | ORAL_TABLET | Freq: Every day | ORAL | Status: DC
  Administered 2019-03-15 – 2019-03-16 (×2): 25 mg via ORAL
  Filled 2019-03-15 (×2): qty 1

## 2019-03-15 MED ORDER — PHENYLEPHRINE 40 MCG/ML (10ML) SYRINGE FOR IV PUSH (FOR BLOOD PRESSURE SUPPORT): 80.0000 ug | PREFILLED_SYRINGE | INTRAVENOUS | Status: DC | PRN

## 2019-03-15 MED ORDER — SENNOSIDES-DOCUSATE SODIUM 8.6-50 MG PO TABS
2.0000 | ORAL_TABLET | ORAL | Status: DC
  Administered 2019-03-15: 2 via ORAL
  Filled 2019-03-15: qty 2

## 2019-03-15 MED ORDER — ONDANSETRON HCL 4 MG/2ML IJ SOLN: 4.0000 mg | Freq: Four times a day (QID) | INTRAMUSCULAR | Status: DC | PRN

## 2019-03-15 MED ORDER — DIBUCAINE (PERIANAL) 1 % EX OINT: 1.0000 "application " | TOPICAL_OINTMENT | CUTANEOUS | Status: DC | PRN

## 2019-03-15 MED ORDER — ACETAMINOPHEN 325 MG PO TABS: 650.0000 mg | ORAL_TABLET | ORAL | Status: DC | PRN

## 2019-03-15 MED ORDER — TETANUS-DIPHTH-ACELL PERTUSSIS 5-2.5-18.5 LF-MCG/0.5 IM SUSP: 0.5000 mL | Freq: Once | INTRAMUSCULAR | Status: DC

## 2019-03-15 MED ORDER — ONDANSETRON HCL 4 MG/2ML IJ SOLN: 4.0000 mg | INTRAMUSCULAR | Status: DC | PRN

## 2019-03-15 MED ORDER — LACTATED RINGERS IV SOLN: 500.0000 mL | INTRAVENOUS | Status: DC | PRN

## 2019-03-15 MED ORDER — OXYTOCIN 40 UNITS IN NORMAL SALINE INFUSION - SIMPLE MED
1.0000 m[IU]/min | INTRAVENOUS | Status: DC
  Administered 2019-03-15: 2 m[IU]/min via INTRAVENOUS
  Filled 2019-03-15: qty 1000

## 2019-03-15 MED ORDER — ONDANSETRON HCL 4 MG PO TABS: 4.0000 mg | ORAL_TABLET | ORAL | Status: DC | PRN

## 2019-03-15 MED ORDER — OXYTOCIN 40 UNITS IN NORMAL SALINE INFUSION - SIMPLE MED: 2.5000 [IU]/h | INTRAVENOUS | Status: DC

## 2019-03-15 MED ORDER — EPHEDRINE 5 MG/ML INJ: 10.0000 mg | INTRAVENOUS | Status: DC | PRN

## 2019-03-15 MED ORDER — LACTATED RINGERS IV SOLN: 500.0000 mL | Freq: Once | INTRAVENOUS | Status: DC

## 2019-03-15 MED ORDER — BUPROPION HCL ER (SR) 150 MG PO TB12
150.0000 mg | ORAL_TABLET | Freq: Every day | ORAL | Status: DC
  Administered 2019-03-15 – 2019-03-16 (×2): 150 mg via ORAL
  Filled 2019-03-15 (×2): qty 1

## 2019-03-15 MED ORDER — IBUPROFEN 600 MG PO TABS
600.0000 mg | ORAL_TABLET | Freq: Four times a day (QID) | ORAL | Status: DC
  Administered 2019-03-15 – 2019-03-16 (×5): 600 mg via ORAL
  Filled 2019-03-15 (×5): qty 1

## 2019-03-15 MED ORDER — DIPHENHYDRAMINE HCL 50 MG/ML IJ SOLN: 12.5000 mg | INTRAMUSCULAR | Status: DC | PRN

## 2019-03-15 MED ORDER — WITCH HAZEL-GLYCERIN EX PADS: 1.0000 "application " | MEDICATED_PAD | CUTANEOUS | Status: DC | PRN

## 2019-03-15 MED ORDER — LIDOCAINE HCL (PF) 1 % IJ SOLN
30.0000 mL | INTRAMUSCULAR | Status: AC | PRN
  Administered 2019-03-15: 60 mL via SUBCUTANEOUS
  Filled 2019-03-15 (×2): qty 30

## 2019-03-15 MED ORDER — BENZOCAINE-MENTHOL 20-0.5 % EX AERO
1.0000 "application " | INHALATION_SPRAY | CUTANEOUS | Status: DC | PRN
  Administered 2019-03-15: 1 via TOPICAL
  Filled 2019-03-15: qty 56

## 2019-03-15 MED ORDER — DIPHENHYDRAMINE HCL 25 MG PO CAPS: 25.0000 mg | ORAL_CAPSULE | Freq: Four times a day (QID) | ORAL | Status: DC | PRN

## 2019-03-15 MED ORDER — OXYCODONE-ACETAMINOPHEN 5-325 MG PO TABS: 2.0000 | ORAL_TABLET | ORAL | Status: DC | PRN

## 2019-03-15 MED ORDER — SOD CITRATE-CITRIC ACID 500-334 MG/5ML PO SOLN: 30.0000 mL | ORAL | Status: DC | PRN

## 2019-03-15 MED ORDER — SIMETHICONE 80 MG PO CHEW: 80.0000 mg | CHEWABLE_TABLET | ORAL | Status: DC | PRN

## 2019-03-15 MED ORDER — OXYTOCIN BOLUS FROM INFUSION
500.0000 mL | Freq: Once | INTRAVENOUS | Status: AC
  Administered 2019-03-15: 500 mL via INTRAVENOUS

## 2019-03-15 MED ORDER — COCONUT OIL OIL: 1.0000 "application " | TOPICAL_OIL | Status: DC | PRN

## 2019-03-15 NOTE — Plan of Care (Signed)

## 2019-03-15 NOTE — Lactation Note (Signed)
This note was copied from a baby's chart. Lactation Consultation Note  Patient Name: Ann Parks Today's Date: 03/15/2019 Reason for consult: Initial assessment;Term;Other (Comment)(AMA)  88 hours old FT female who is being exclusively BF by her mother, she's P2 and experienced BF. She was able to BF her first child for 24 months, mom has a Hx of depression/anxiety on Welbutrin an L3. She's already familiar with hand expression and able to easily get some colostrum when doing so, LC revised hand expression with mom and showed parents how to finger feed baby. Mom has a Medela DEBP at home.  Offered assistance with latch but mom politely declined even though baby was crying and cueing; she stated that baby just fed for 20 minutes . Asked mom to call for assistance when needed. Reviewed normal newborn behavior, cluster feeding, feeding cues and prevention/treatment for sore nipples.  Feeding plan:  1. Encouraged mom to feed baby STS 8-12 times/24 hours or sooner if feeding cues are present 2. Hand expression and spoon feeding were also strongly encouraged  BF brochure, BF resources and feeding diary were reviewed. Parents reported all questions and concerns were answered, they're both aware of [facility] services and will call PRN.  Maternal Data Formula Feeding for Exclusion: No Has patient been taught Hand Expression?: Yes Does the patient have breastfeeding experience prior to this delivery?: Yes  Feeding Feeding Type: Breast Fed   Interventions Interventions: Breast feeding basics reviewed;Breast massage;Hand express;Breast compression  Lactation Tools Discussed/Used WIC Program: No   Consult Status Consult Status: PRN Follow-up type: In-patient    Taym Twist Francene Boyers 03/15/2019, 10:01 PM

## 2019-03-15 NOTE — Progress Notes (Signed)
Patient ID: Ann Parks, female   DOB: 1980/09/07, 38 y.o.   MRN: [id number] DOD   Doing well.  BP with occasional 120-130/90 but WNL for most part Back on metoprolol   Continue pp care

## 2019-03-15 NOTE — Plan of Care (Signed)
  Problem: Health Behavior/Discharge Planning: Goal: Ability to manage health-related needs will improve 03/15/2019 0515 by Laruth Bouchard, RN Outcome: Progressing 03/15/2019 0025 by Laruth Bouchard, RN Outcome: Progressing   Problem: Clinical Measurements: Goal: Ability to maintain clinical measurements within normal limits will improve 03/15/2019 0515 by Laruth Bouchard, RN Outcome: Progressing 03/15/2019 0025 by Laruth Bouchard, RN Outcome: Progressing Goal: Will remain free from infection 03/15/2019 0515 by Laruth Bouchard, RN Outcome: Progressing 03/15/2019 0025 by Laruth Bouchard, RN Outcome: Progressing Goal: Diagnostic test results will improve 03/15/2019 0515 by Laruth Bouchard, RN Outcome: Progressing 03/15/2019 0025 by Laruth Bouchard, RN Outcome: Progressing Goal: Respiratory complications will improve 03/15/2019 0515 by Laruth Bouchard, RN Outcome: Progressing 03/15/2019 0025 by Laruth Bouchard, RN Outcome: Progressing Goal: Cardiovascular complication will be avoided 03/15/2019 0515 by Laruth Bouchard, RN Outcome: Progressing 03/15/2019 0025 by Laruth Bouchard, RN Outcome: Progressing   Problem: Activity: Goal: Risk for activity intolerance will decrease 03/15/2019 0515 by Laruth Bouchard, RN Outcome: Progressing 03/15/2019 0025 by Laruth Bouchard, RN Outcome: Progressing   Problem: Nutrition: Goal: Adequate nutrition will be maintained 03/15/2019 0515 by Laruth Bouchard, RN Outcome: Progressing 03/15/2019 0025 by Laruth Bouchard, RN Outcome: Progressing   Problem: Elimination: Goal: Will not experience complications related to bowel motility 03/15/2019 0515 by Laruth Bouchard, RN Outcome: Progressing 03/15/2019 0025 by Laruth Bouchard, RN Outcome: Progressing Goal: Will not experience complications related to urinary retention 03/15/2019 0515 by Laruth Bouchard, RN Outcome: Progressing 03/15/2019 0025 by Laruth Bouchard, RN Outcome: Progressing   Problem: Skin Integrity: Goal: Risk for impaired skin integrity will decrease 03/15/2019 0515 by Laruth Bouchard, RN Outcome: Progressing 03/15/2019 0025 by Laruth Bouchard, RN Outcome: Progressing

## 2019-03-15 NOTE — Progress Notes (Signed)
Patient ID: Ann Parks, female   DOB: 20-Oct-1980, 38 y.o.   MRN: [id number] Pt feeling minimal contractions  afeb VSS FHR category 1  Cervix 80/3-4/-1 AROM clear   Pitocin per protocol Trying to labor medication free

## 2019-03-16 MED ORDER — IBUPROFEN 600 MG PO TABS: 600.0000 mg | ORAL_TABLET | Freq: Four times a day (QID) | ORAL | 1 refills | Status: AC | PRN

## 2019-03-16 NOTE — Discharge Summary (Signed)
OB Discharge Summary     Patient Name: Ann Parks DOB: Sep 25, 1980 MRN: [id number]  Date of admission: 03/15/2019 Delivering MD: Paula Compton   Date of discharge: 03/16/2019  Admitting diagnosis: PREG Intrauterine pregnancy: [redacted]w[redacted]d     Secondary diagnosis:  Active Problems:   AMA (advanced maternal age) multigravida 35+   Chronic hypertension complicating or reason for care during pregnancy, third trimester   Indication for care in labor and delivery, antepartum   NSVD (normal spontaneous vaginal delivery)  Additional problems: none     Discharge diagnosis: Term Pregnancy Delivered and CHTN                                                                                                Post partum procedures:none  Augmentation: AROM and Pitocin  Complications: None  Hospital course:  Induction of Labor With Vaginal Delivery   38 y.o. yo G2P2002 at [redacted]w[redacted]d was admitted to the hospital 03/15/2019 for induction of labor.  Indication for induction: Favorable cervix at term and chtn.  Patient had an uncomplicated labor course as follows: Membrane Rupture Time/Date: 1:22 AM ,03/15/2019   Intrapartum Procedures: Episiotomy: None [1]                                         Lacerations:  2nd degree [3]  Patient had delivery of a Viable infant.  Information for the patient's newborn:  Jemila, Camille [[id number]]  Delivery Method: Vaginal, Spontaneous(Filed from Delivery Summary)    03/15/2019  Details of delivery can be found in separate delivery note.  Patient had a routine postpartum course. Patient is discharged home 03/16/19.  Physical exam  Vitals:   03/15/19 1101 03/15/19 1515 03/15/19 1930 03/16/19 0603  BP: 121/83 116/77 133/89 120/81  Pulse: 61 70 65 64  Resp:  18 16 18   Temp:  98.2 F (36.8 C) 98 F (36.7 C) (!) 97.5 F (36.4 C)  TempSrc:  Oral Oral Oral  SpO2:  98% 100% 100%  Weight:      Height:       General: alert, cooperative and no distress Lochia:  appropriate Uterine Fundus: firm Incision: N/A DVT Evaluation: No evidence of DVT seen on physical exam. Labs: Lab Results  Component Value Date   WBC 22.5 (H) 03/15/2019   HGB 11.7 (L) 03/15/2019   HCT 34.9 (L) 03/15/2019   MCV 89.0 03/15/2019   PLT 220 03/15/2019   CMP Latest Ref Rng & Units 03/15/2019  Glucose 70 - 99 mg/dL 88  BUN 6 - 20 mg/dL 13  Creatinine 0.44 - 1.00 mg/dL 0.85  Sodium 135 - 145 mmol/L 138  Potassium 3.5 - 5.1 mmol/L 4.0  Chloride 98 - 111 mmol/L 107  CO2 22 - 32 mmol/L 22  Calcium 8.9 - 10.3 mg/dL 9.7  Total Protein 6.5 - 8.1 g/dL 6.2(L)  Total Bilirubin 0.3 - 1.2 mg/dL 0.4  Alkaline Phos 38 - 126 U/L 118  AST 15 - 41 U/L 21  ALT 0 -  44 U/L 13    Discharge instruction: per After Visit Summary and "Baby and Me Booklet".  After visit meds:  Allergies as of 03/16/2019      Reactions   Penicillins       Medication List    TAKE these medications   buPROPion 150 MG 12 hr tablet Commonly known as: WELLBUTRIN SR Take 150 mg by mouth daily.   ibuprofen 600 MG tablet Commonly known as: ADVIL Take 1 tablet (600 mg total) by mouth every 6 (six) hours as needed for moderate pain or cramping. What changed:   when to take this  reasons to take this   metoprolol succinate 25 MG 24 hr tablet Commonly known as: TOPROL-XL Take 25 mg by mouth daily.   prenatal multivitamin Tabs tablet Take 1 tablet by mouth daily at 12 noon.       Diet: low salt diet  Activity: Advance as tolerated. Pelvic rest for 6 weeks.   Outpatient follow up:6 weeks Follow up Appt:No future appointments. Follow up Visit:No follow-ups on file.  Postpartum contraception: Not Discussed  Newborn Data: Live born female  Birth Weight: 7 lb 4.1 oz (3291 g) APGAR: 8, 9  Newborn Delivery   Birth date/time: 03/15/2019 04:13:00 Delivery type: Vaginal, Spontaneous      Baby Feeding: Breast Disposition:home with mother   03/16/2019 Cathrine Musterecilia W Hobert Poplaski, DO

## 2019-03-16 NOTE — Discharge Instructions (Signed)
Call office with any concerns [phone] 

## 2019-03-16 NOTE — Progress Notes (Signed)
Patient ID: Ann Parks, female   DOB: 05-12-1981, 38 y.o.   MRN: [id number] PPD#1 Pt doing well. Bonding well with baby - breastfeeding. Denies HA, CP, SOB or visual changes. Lochia mild. Pain well controlled. Desires discharge to home today VSS- 116-133/77-89 GEN - NAD ABD - FF EXT - no homans  22.5>11.7<220  A/P: PPD#1 s/p svd - stable         Discharge instructions reviewed; Follow up in 6 weeks for postpartum visit

## 2021-01-16 ENCOUNTER — Emergency Department (HOSPITAL_COMMUNITY)
Admission: EM | Admit: 2021-01-16 | Discharge: 2021-01-16 | Disposition: A | Discharge status: Home / Self Care | Payer: Managed Care, Other (non HMO) | Attending: Emergency Medicine | Admitting: Emergency Medicine

## 2021-01-16 ENCOUNTER — Emergency Department (HOSPITAL_COMMUNITY): Payer: Managed Care, Other (non HMO)

## 2021-01-16 ENCOUNTER — Other Ambulatory Visit: Payer: Self-pay

## 2021-01-16 ENCOUNTER — Encounter (HOSPITAL_COMMUNITY): Payer: Self-pay

## 2021-01-16 DIAGNOSIS — S82042A Displaced comminuted fracture of left patella, initial encounter for closed fracture: Secondary | ICD-10-CM | POA: Insufficient documentation

## 2021-01-16 DIAGNOSIS — W108XXA Fall (on) (from) other stairs and steps, initial encounter: Secondary | ICD-10-CM | POA: Insufficient documentation

## 2021-01-16 DIAGNOSIS — S8992XA Unspecified injury of left lower leg, initial encounter: Secondary | ICD-10-CM | POA: Diagnosis present

## 2021-01-16 DIAGNOSIS — I10 Essential (primary) hypertension: Secondary | ICD-10-CM | POA: Diagnosis not present

## 2021-01-16 DIAGNOSIS — Z87891 Personal history of nicotine dependence: Secondary | ICD-10-CM | POA: Insufficient documentation

## 2021-01-16 DIAGNOSIS — Z79899 Other long term (current) drug therapy: Secondary | ICD-10-CM | POA: Diagnosis not present

## 2021-01-16 DIAGNOSIS — W19XXXA Unspecified fall, initial encounter: Secondary | ICD-10-CM

## 2021-01-16 MED ORDER — OXYCODONE-ACETAMINOPHEN 5-325 MG PO TABS
1.0000 | ORAL_TABLET | Freq: Once | ORAL | Status: AC
  Administered 2021-01-16: 1 via ORAL
  Filled 2021-01-16: qty 1

## 2021-01-16 MED ORDER — HYDROCODONE-ACETAMINOPHEN 5-325 MG PO TABS: 1.0000 | ORAL_TABLET | Freq: Four times a day (QID) | ORAL | 0 refills | Status: AC | PRN

## 2021-01-16 MED ORDER — FENTANYL CITRATE (PF) 100 MCG/2ML IJ SOLN
50.0000 ug | Freq: Once | INTRAMUSCULAR | Status: AC
  Administered 2021-01-16: 50 ug via INTRAVENOUS
  Filled 2021-01-16: qty 2

## 2021-01-16 MED ORDER — ONDANSETRON 4 MG PO TBDP
4.0000 mg | ORAL_TABLET | Freq: Once | ORAL | Status: AC
  Administered 2021-01-16: 4 mg via ORAL
  Filled 2021-01-16: qty 1

## 2021-01-16 MED ORDER — HYDROCODONE-ACETAMINOPHEN 5-325 MG PO TABS: 1.0000 | ORAL_TABLET | Freq: Four times a day (QID) | ORAL | 0 refills | Status: DC | PRN

## 2021-01-16 NOTE — Progress Notes (Signed)
Orthopedic Tech Progress Note Patient Details:  Ann Parks Dec 23, 1980 [id number]  Ortho Devices Type of Ortho Device: Knee Immobilizer,Crutches Ortho Device/Splint Location: lle knee padded with 2 abd pads and wrapped with ace wrap. then i applied a knee immobilizer. Ortho Device/Splint Interventions: Ordered,Application,Adjustment   Post Interventions Patient Tolerated: Well Instructions Provided: Care of device,Adjustment of device   Shadiyah, Wernli 01/16/2021, 8:52 PM

## 2021-01-16 NOTE — ED Provider Notes (Signed)
[facility] EMERGENCY DEPARTMENT Provider Note   CSN: [id number] Arrival date & time: 01/16/21  1705     History Chief Complaint  Patient presents with  . Fall    Ann Parks is a 40 y.o. female.  HPI   40 y/o female with a h/o anxiety/depression, hematochezia, htn, who presents to the ed today for eval of left knee pain after a fall. States she was walking down two steps and slipped on the ground lending directly onto the left knee. She has had severe pain/swelling to the left knee since. Denies numbness/weakness to the lle. Denies head trauma, loc or other injuries  Past Medical History:  Diagnosis Date  . Anxiety   . Depression   . Hematochezia 11/2010  . History of chicken pox   . Hypertension   . Vaginal yeast infection     Patient Active Problem List   Diagnosis Date Noted  . AMA (advanced maternal age) multigravida 35+ 03/15/2019  . Chronic hypertension complicating or reason for care during pregnancy, third trimester 03/15/2019  . Indication for care in labor and delivery, antepartum 03/15/2019  . NSVD (normal spontaneous vaginal delivery) 03/15/2019  . Indication for care in labor or delivery 10/16/2016  . PROM (premature rupture of membranes) 10/16/2016  . SVD (spontaneous vaginal delivery) 10/16/2016    No past surgical history on file.   OB History    Gravida  2   Para  2   Term  2   Preterm      AB      Living  2     SAB      IAB      Ectopic      Multiple  0   Live Births  2           Family History  Problem Relation Age of Onset  . Depression Mother   . Fibromyalgia Mother   . Heart disease Mother   . Hypertension Father   . Depression Brother   . Miscarriages / Stillbirths Maternal Grandmother   . Heart disease Maternal Grandfather   . Cancer Paternal Grandmother   . Obesity Paternal Grandmother   . Obesity Paternal Grandfather   . Stroke Paternal Grandfather     Social History   Tobacco Use   . Smoking status: Former Smoker    Types: Cigarettes    Quit date: 01/29/2003    Years since quitting: 17.9  . Smokeless tobacco: Never Used  Substance Use Topics  . Alcohol use: Yes    Comment: occassional  . Drug use: No    Home Medications Prior to Admission medications   Medication Sig Start Date End Date Taking? Authorizing Provider  buPROPion (WELLBUTRIN SR) 150 MG 12 hr tablet Take 150 mg by mouth daily.  09/14/16   [provider]  HYDROcodone-acetaminophen (NORCO/VICODIN) 5-325 MG tablet Take 1 tablet by mouth every 6 (six) hours as needed. 01/16/21   Jolea Dolle S, PA-C  ibuprofen (ADVIL) 600 MG tablet Take 1 tablet (600 mg total) by mouth every 6 (six) hours as needed for moderate pain or cramping. 03/16/19   Banga, Sharol Given, DO  metoprolol succinate (TOPROL-XL) 25 MG 24 hr tablet Take 25 mg by mouth daily. 09/14/16   [provider]  Prenatal Vit-Fe Fumarate-FA (PRENATAL MULTIVITAMIN) TABS tablet Take 1 tablet by mouth daily at 12 noon.    [provider]    Allergies    Penicillins  Review of Systems  Review of Systems  Cardiovascular: Negative for chest pain.  Gastrointestinal: Negative for abdominal pain.  Genitourinary: Negative for flank pain.  Musculoskeletal: Negative for back pain and neck pain.       Left knee pain  Neurological:       No head trauma or loc    Physical Exam Updated Vital Signs BP 123/71   Pulse 70   Temp 98.3 F (36.8 C) (Oral)   Resp 12   LMP 01/13/2021 (Approximate)   SpO2 98%   Breastfeeding No   Physical Exam Vitals and nursing note reviewed.  Constitutional:      General: She is not in acute distress.    Appearance: She is well-developed.  HENT:     Head: Normocephalic and atraumatic.  Eyes:     Conjunctiva/sclera: Conjunctivae normal.  Cardiovascular:     Rate and Rhythm: Normal rate.  Pulmonary:     Effort: Pulmonary effort is normal.  Musculoskeletal:     Cervical back: Neck  supple.     Comments: TTP and swelling over the left patella, decreased ROM. NVI dsitally  Skin:    General: Skin is warm and dry.  Neurological:     Mental Status: She is alert.     ED Results / Procedures / Treatments   Labs (all labs ordered are listed, but only abnormal results are displayed) Labs Reviewed - No data to display  EKG None  Radiology DG Knee Complete 4 Views Left  Result Date: 01/16/2021 CLINICAL DATA:  Fall EXAM: LEFT KNEE - COMPLETE 4+ VIEW COMPARISON:  None. FINDINGS: There is a comminuted fracture of the patella. Patellar fragments are distracted approximately 2.9 cm on lateral imaging. Superior fragment of the patella is superiorly displaced. Inferior fragment of the patella is moderately comminuted. Soft tissue edema. No unexpected radiopaque foreign body. IMPRESSION: Comminuted displaced fracture of the patella. Electronically Signed   By: Meda Klinefelter MD   On: 01/16/2021 18:38    Procedures Procedures   Medications Ordered in ED Medications  fentaNYL (SUBLIMAZE) injection 50 mcg (50 mcg Intravenous Given 01/16/21 1757)  oxyCODONE-acetaminophen (PERCOCET/ROXICET) 5-325 MG per tablet 1 tablet (1 tablet Oral Given 01/16/21 1914)  ondansetron (ZOFRAN-ODT) disintegrating tablet 4 mg (4 mg Oral Given 01/16/21 1914)    ED Course  I have reviewed the triage vital signs and the nursing notes.  Pertinent labs & imaging results that were available during my care of the patient were reviewed by me and considered in my medical decision making (see chart for details).    MDM Rules/Calculators/A&P                          Pt with mechanical fall onto the left knee pta after slipping on the ground. Xray reviewed/interpreted and shows fractured patella.   7:37 pm consult with dr Charlann Boxer with orthopedics who recommends padding the leg, placing ace wrap, and placing knee immobilizer. Pt should f/u with either Dr. Carola Frost or Dr. Jena Gauss  Discussed plan with pt. She is  agreeable. All questions answered, pt stable for discharge.   Final Clinical Impression(s) / ED Diagnoses Final diagnoses:  Fall, initial encounter  Closed displaced comminuted fracture of left patella, initial encounter    Rx / DC Orders ED Discharge Orders         Ordered    HYDROcodone-acetaminophen (NORCO/VICODIN) 5-325 MG tablet  Every 6 hours PRN,   Status:  Discontinued  01/16/21 1953    HYDROcodone-acetaminophen (NORCO/VICODIN) 5-325 MG tablet  Every 6 hours PRN        01/16/21 2015           Karrie Meres, PA-C 01/16/21 2200    Tegeler, Canary Brim, MD 01/16/21 2256

## 2021-01-16 NOTE — ED Notes (Signed)
RN spoke with ortho tech, who said he will come and apply knee immobilizer to pt

## 2021-01-16 NOTE — ED Triage Notes (Signed)
BIB EMS. Fell from standing position to concrete, pt fell on her left knee. Knee is swollen and possibly dislocated. Good pulses, good circulation. Did not hit head, did not lose consciousness, not on blood thinner.

## 2021-01-16 NOTE — Discharge Instructions (Signed)
Prescription given for Norco. Take medication as directed and do not operate machinery, drive a car, or work while taking this medication as it can make you drowsy.   You may take 600mg  ibuprofen every 6 hours as needed for pain as well.   Please follow up with either Dr or Dr. Jena Gauss next week. Please do not put any weight on your leg.   Please return to the emergency department for any new or worsening symptoms.

## 2021-01-16 NOTE — ED Notes (Signed)
E-signature pad unavailable at time of pt discharge. This RN discussed discharge materials with pt and answered all pt questions. Pt stated understanding of discharge material. ? ?

## 2021-01-16 NOTE — ED Notes (Addendum)
Ortho tech at pt bedside for ace wrap & knee immobilization

## 2021-11-07 ENCOUNTER — Other Ambulatory Visit: Payer: Self-pay | Admitting: Obstetrics and Gynecology

## 2021-11-07 DIAGNOSIS — R928 Other abnormal and inconclusive findings on diagnostic imaging of breast: Secondary | ICD-10-CM

## 2021-11-22 ENCOUNTER — Ambulatory Visit
Admission: RE | Admit: 2021-11-22 | Discharge: 2021-11-22 | Disposition: A | Discharge status: Home / Self Care | Payer: Managed Care, Other (non HMO) | Source: Ambulatory Visit | Attending: Obstetrics and Gynecology | Admitting: Obstetrics and Gynecology

## 2021-11-22 ENCOUNTER — Other Ambulatory Visit: Payer: Self-pay | Admitting: Obstetrics and Gynecology

## 2021-11-22 DIAGNOSIS — N6489 Other specified disorders of breast: Secondary | ICD-10-CM

## 2021-11-22 DIAGNOSIS — R928 Other abnormal and inconclusive findings on diagnostic imaging of breast: Secondary | ICD-10-CM

## 2022-06-01 IMAGING — MG MM DIGITAL DIAGNOSTIC UNILAT*L* W/ TOMO W/ CAD
8 series · 8 of 24 positions shown · non-contrast
Comparison: Previous exam(s).

CLINICAL DATA: 40-year-old female presenting as a recall from
baseline screening for possible left breast asymmetry.

EXAM:
DIGITAL DIAGNOSTIC UNILATERAL LEFT MAMMOGRAM WITH TOMOSYNTHESIS AND
CAD; ULTRASOUND LEFT BREAST LIMITED
TECHNIQUE: Left digital diagnostic mammography and breast tomosynthesis was
performed. The images were evaluated with computer-aided detection.;
Targeted ultrasound examination of the left breast was performed.

[L ML synth-2D]
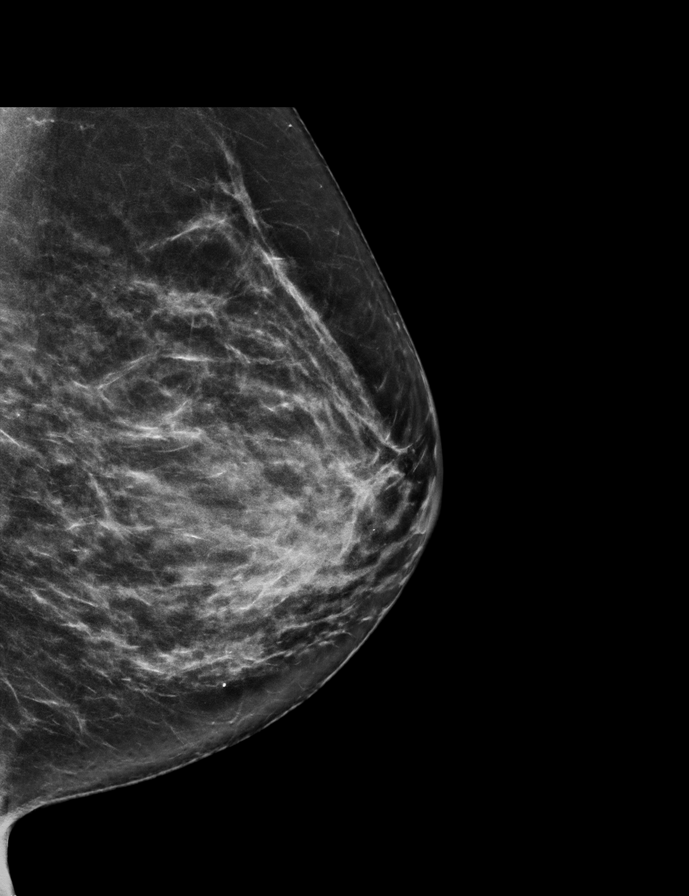

[L CC synth-2D (1 of 3)]
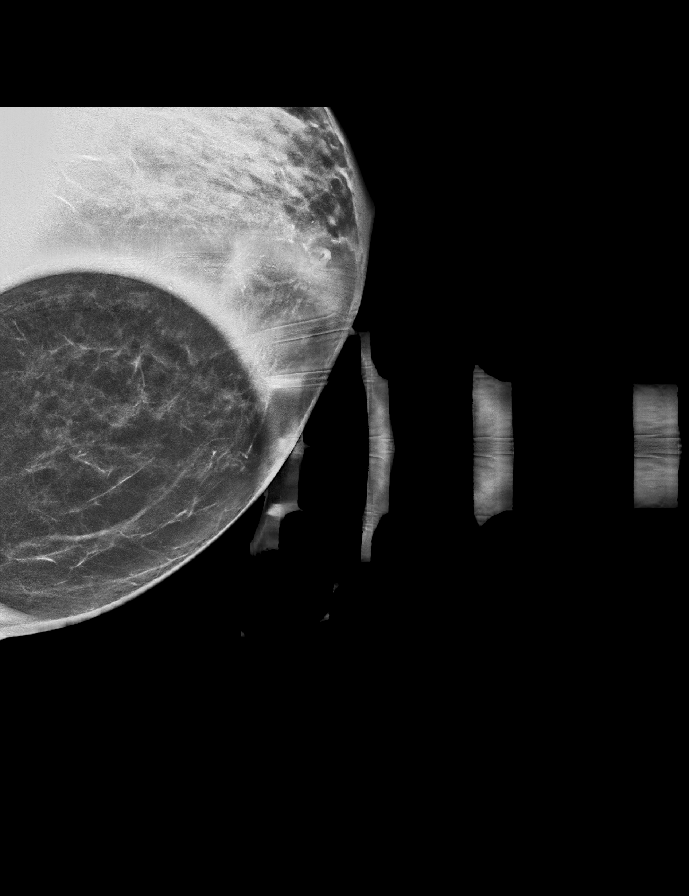

[L CC synth-2D (2 of 3)]
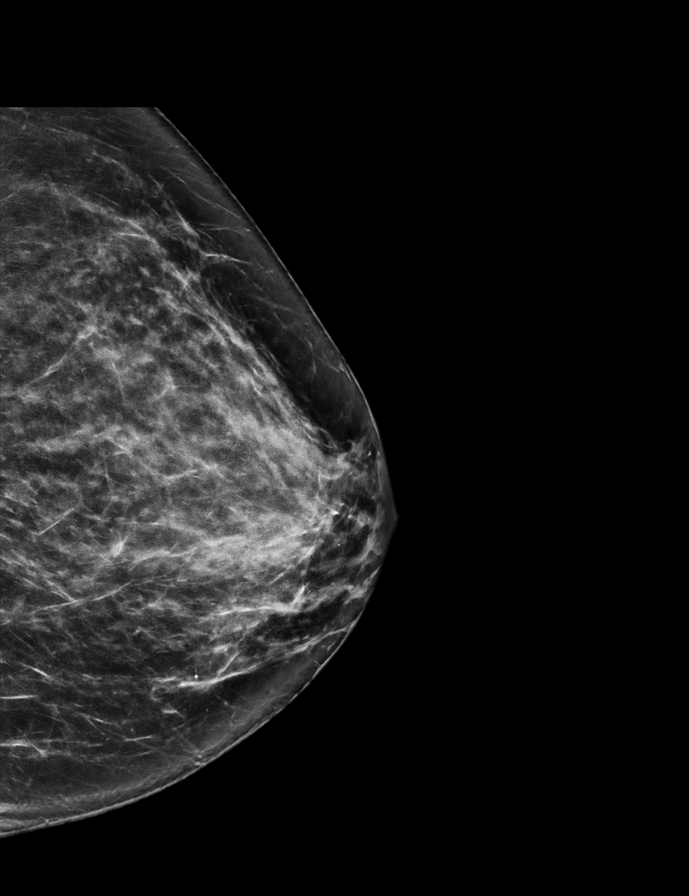

[L CC synth-2D (3 of 3)]
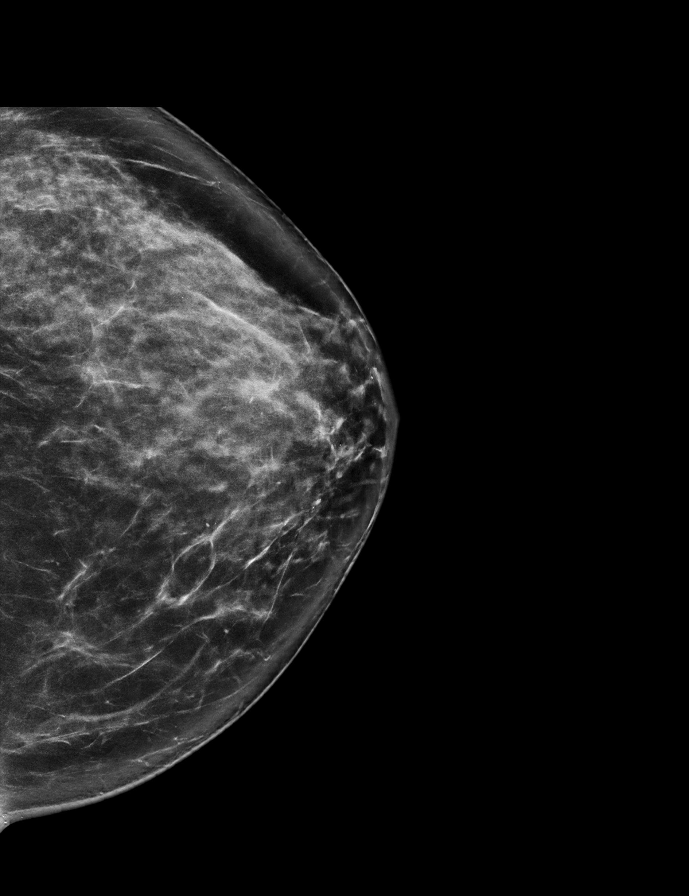

[L ML tomo · tomo slice 35/69.0]
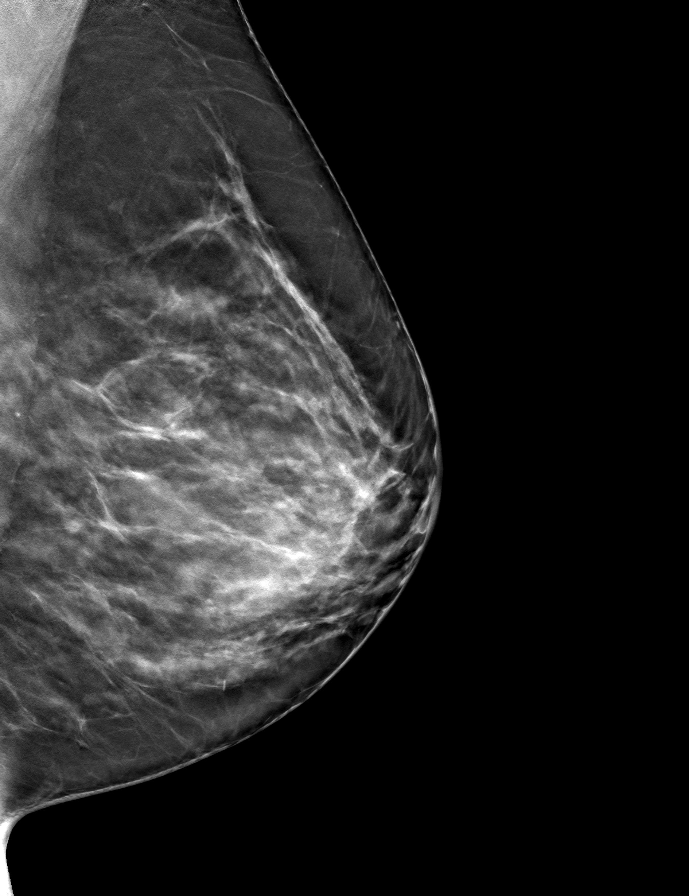

[L CC tomo (1 of 3) · tomo slice 38/75.0]
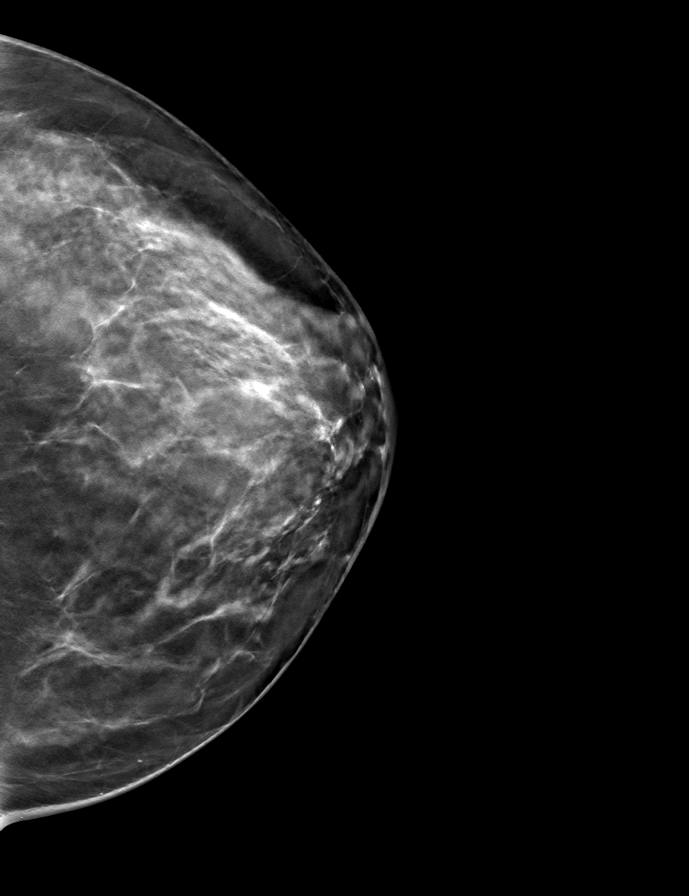

[L CC tomo (2 of 3) · tomo slice 35/68.0]
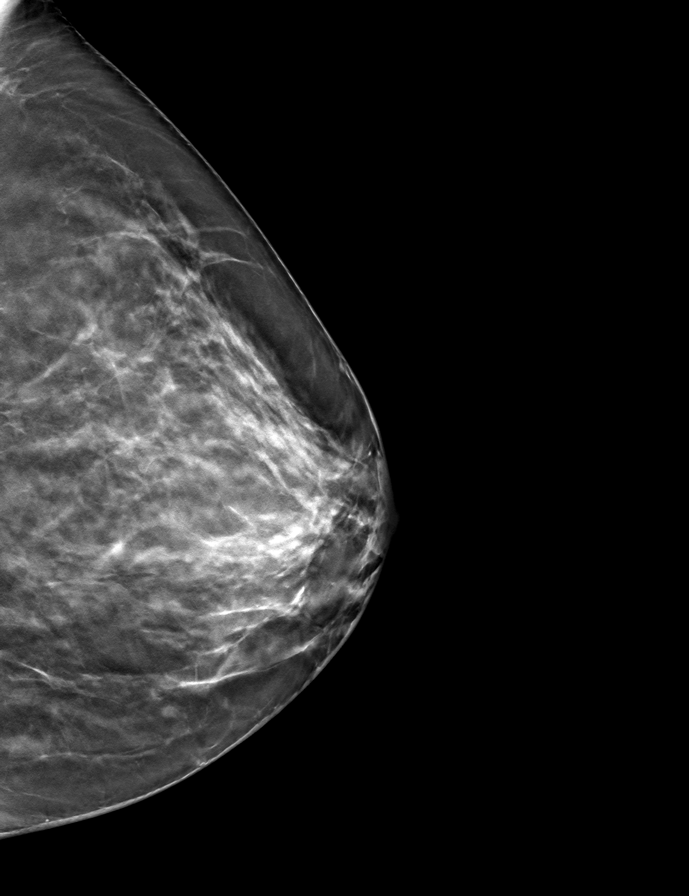

[L CC tomo (3 of 3) · tomo slice 31/61.0]
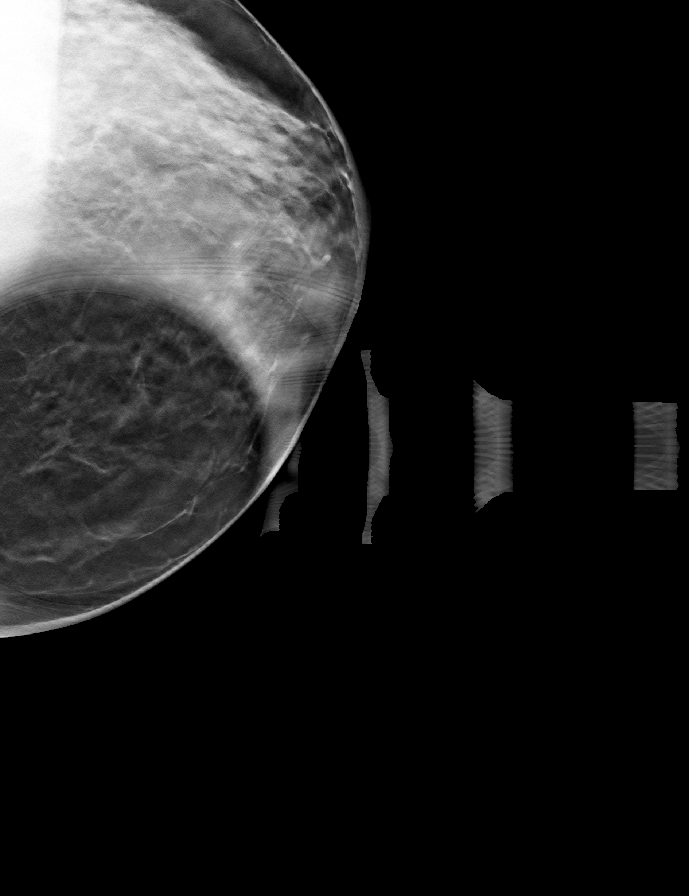

[8 of 24 positions shown; findings below may reference images not displayed]

ACR Breast Density Category c: The breast tissue is heterogeneously
dense, which may obscure small masses.
FINDINGS: Mammogram:

Full field cc, rolled lateral cc and mL views as well as spot
compression tomosynthesis views of the left breast were performed
for a questioned asymmetry seen only on CC view in the medial
posterior left breast. The asymmetry persists on the full view
though effaces on the spot imaging and most likely represents normal
fibroglandular tissue. There is no mass or distortion.

Ultrasound:

Targeted ultrasound is performed throughout the upper inner aspect
of the left breast demonstrating no cystic or solid mass or focal
area of shadowing.
IMPRESSION: Probably benign asymmetry without sonographic correlate in the
medial left breast.

RECOMMENDATION:
Diagnostic left breast mammogram in 6 months.

I have discussed the findings and recommendations with the patient
who agrees to short-term follow-up. If applicable, a reminder letter
will be sent to the patient regarding the next appointment.

BI-RADS CATEGORY  3: Probably benign.

## 2022-06-01 IMAGING — US US BREAST*L* LIMITED INC AXILLA
1 series · 5 of 5 positions shown · non-contrast
Comparison: Previous exam(s).

CLINICAL DATA: 40-year-old female presenting as a recall from
baseline screening for possible left breast asymmetry.

EXAM:
DIGITAL DIAGNOSTIC UNILATERAL LEFT MAMMOGRAM WITH TOMOSYNTHESIS AND
CAD; ULTRASOUND LEFT BREAST LIMITED
TECHNIQUE: Left digital diagnostic mammography and breast tomosynthesis was
performed. The images were evaluated with computer-aided detection.;
Targeted ultrasound examination of the left breast was performed.

[Series 1: us breast*left* limited inc axilla · 0.06mm/px · 5 of 5 slices shown]
[im 1/5]
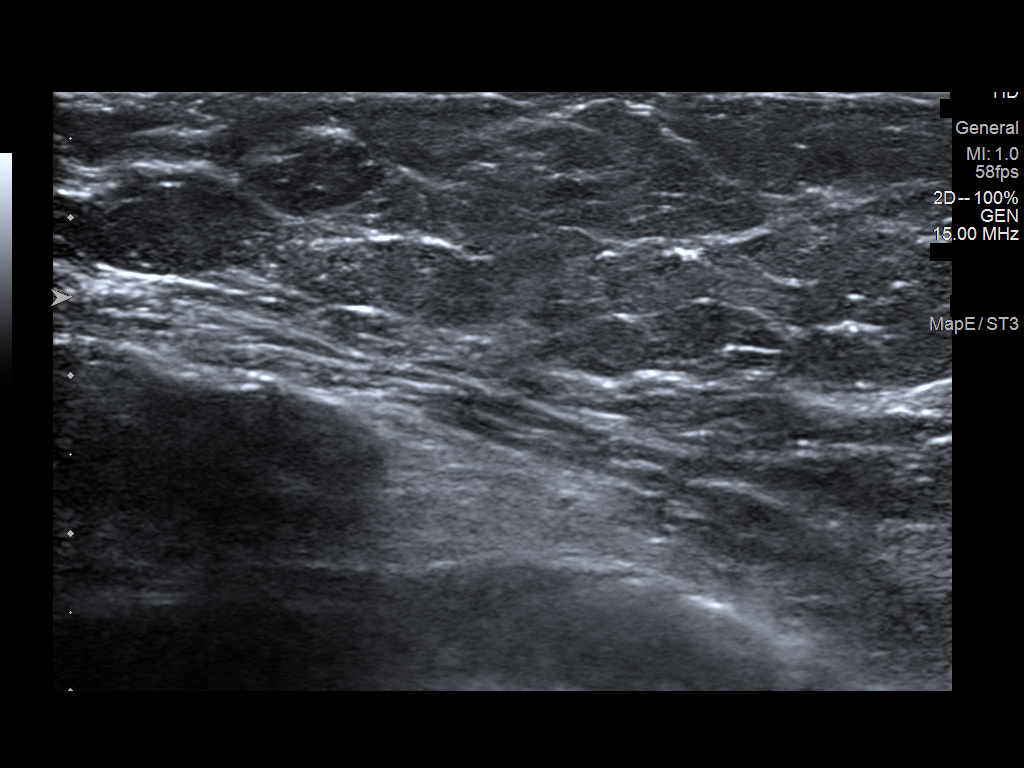
[im 2/5]
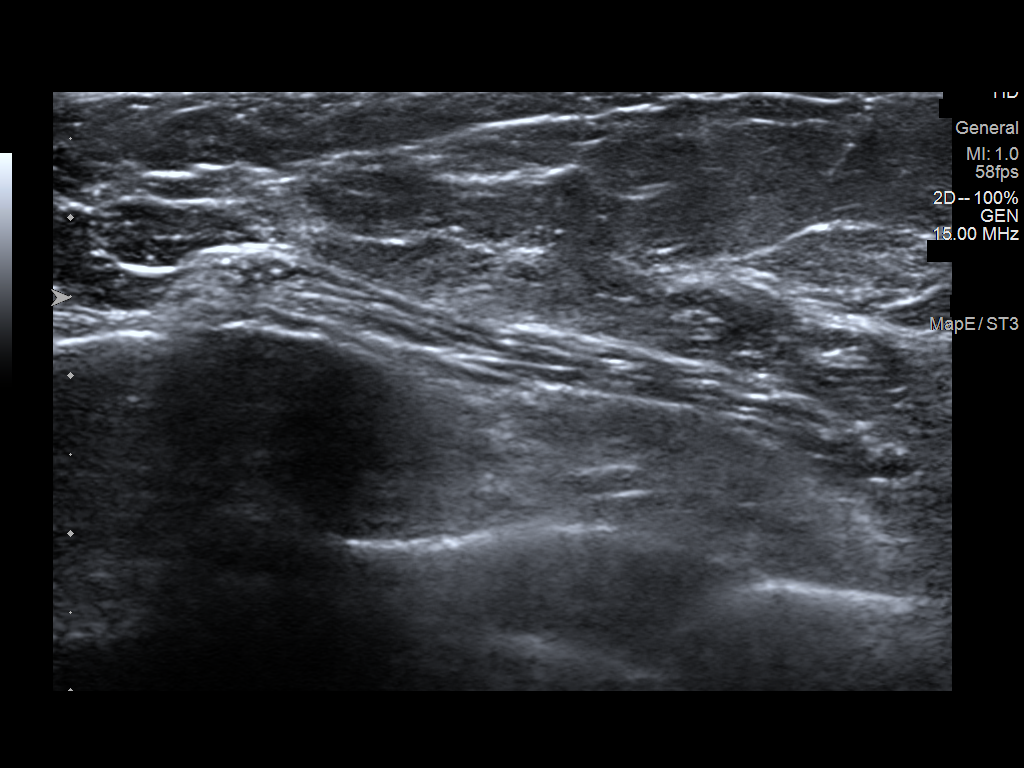
[im 3/5]
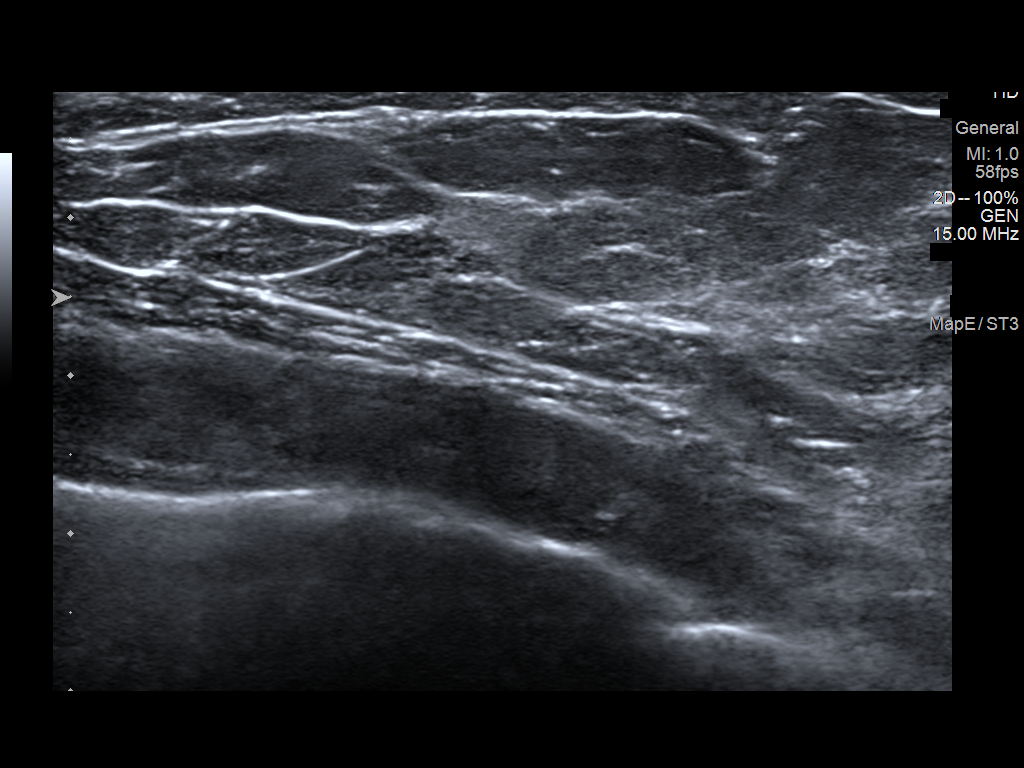
[im 4/5]
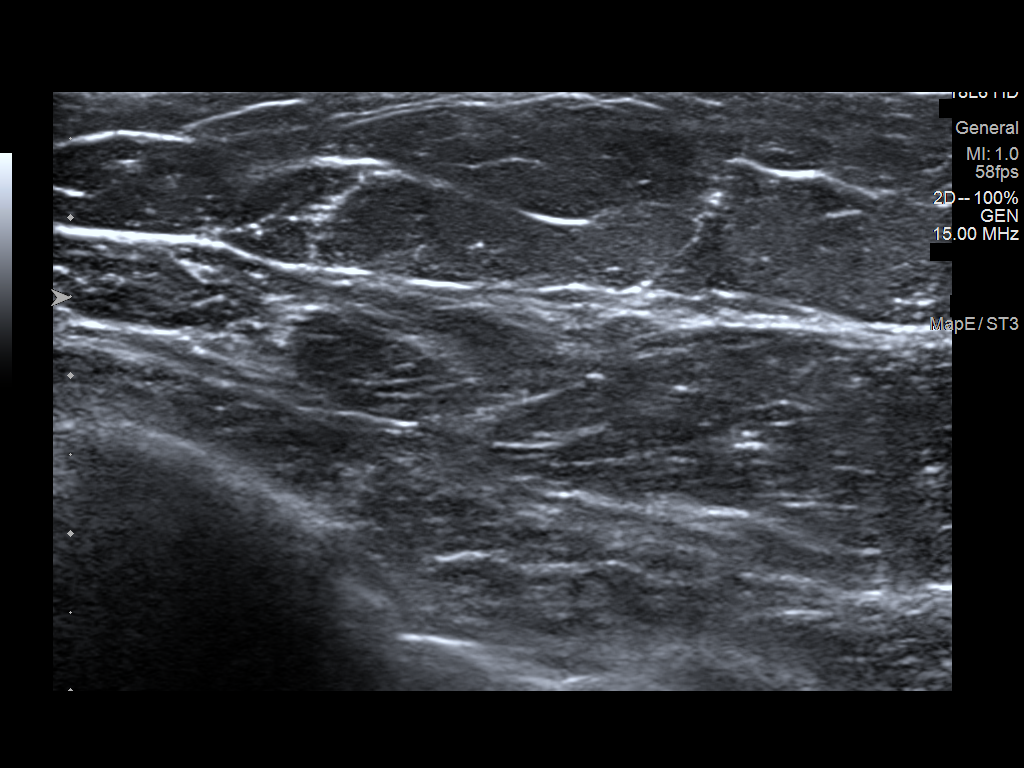
[im 5/5]
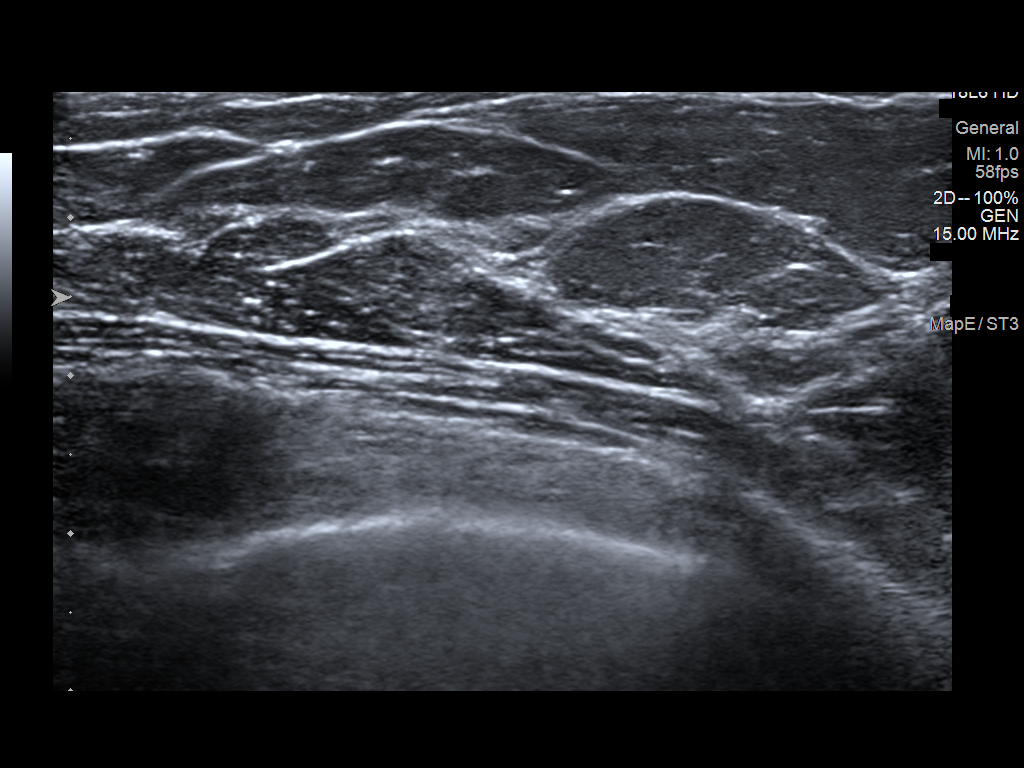

[5 of 5 positions shown; findings below may reference images not displayed]

ACR Breast Density Category c: The breast tissue is heterogeneously
dense, which may obscure small masses.
FINDINGS: Mammogram:

Full field cc, rolled lateral cc and mL views as well as spot
compression tomosynthesis views of the left breast were performed
for a questioned asymmetry seen only on CC view in the medial
posterior left breast. The asymmetry persists on the full view
though effaces on the spot imaging and most likely represents normal
fibroglandular tissue. There is no mass or distortion.

Ultrasound:

Targeted ultrasound is performed throughout the upper inner aspect
of the left breast demonstrating no cystic or solid mass or focal
area of shadowing.
IMPRESSION: Probably benign asymmetry without sonographic correlate in the
medial left breast.

RECOMMENDATION:
Diagnostic left breast mammogram in 6 months.

I have discussed the findings and recommendations with the patient
who agrees to short-term follow-up. If applicable, a reminder letter
will be sent to the patient regarding the next appointment.

BI-RADS CATEGORY  3: Probably benign.

## 2023-02-26 ENCOUNTER — Other Ambulatory Visit: Payer: Self-pay | Admitting: Obstetrics and Gynecology

## 2023-02-26 DIAGNOSIS — R928 Other abnormal and inconclusive findings on diagnostic imaging of breast: Secondary | ICD-10-CM

## 2023-03-27 ENCOUNTER — Ambulatory Visit
Admission: RE | Admit: 2023-03-27 | Discharge: 2023-03-27 | Disposition: A | Discharge status: Home / Self Care | Payer: Commercial Indemnity | Source: Ambulatory Visit | Attending: Obstetrics and Gynecology | Admitting: Obstetrics and Gynecology

## 2023-03-27 DIAGNOSIS — R928 Other abnormal and inconclusive findings on diagnostic imaging of breast: Secondary | ICD-10-CM

## 2024-02-03 ENCOUNTER — Other Ambulatory Visit: Payer: Self-pay | Admitting: Internal Medicine

## 2024-02-03 DIAGNOSIS — G245 Blepharospasm: Secondary | ICD-10-CM

## 2024-03-01 ENCOUNTER — Ambulatory Visit
Admission: RE | Admit: 2024-03-01 | Discharge: 2024-03-01 | Disposition: A | Discharge status: Home / Self Care | Source: Ambulatory Visit | Attending: Internal Medicine | Admitting: Internal Medicine

## 2024-03-01 DIAGNOSIS — G245 Blepharospasm: Secondary | ICD-10-CM

## 2024-03-01 MED ORDER — GADOPICLENOL 0.5 MMOL/ML IV SOLN
7.5000 mL | Freq: Once | INTRAVENOUS | Status: AC | PRN
  Administered 2024-03-01: 7.5 mL via INTRAVENOUS

## 2024-03-20 ENCOUNTER — Encounter: Payer: Self-pay | Admitting: Neurology

## 2024-04-16 ENCOUNTER — Other Ambulatory Visit: Payer: Self-pay | Admitting: Obstetrics and Gynecology

## 2024-04-16 DIAGNOSIS — Z853 Personal history of malignant neoplasm of breast: Secondary | ICD-10-CM

## 2024-04-16 DIAGNOSIS — N6489 Other specified disorders of breast: Secondary | ICD-10-CM

## 2024-04-22 ENCOUNTER — Ambulatory Visit
Admission: RE | Admit: 2024-04-22 | Discharge: 2024-04-22 | Disposition: A | Discharge status: Home / Self Care | Source: Ambulatory Visit | Attending: Obstetrics and Gynecology | Admitting: Obstetrics and Gynecology

## 2024-04-22 DIAGNOSIS — N6489 Other specified disorders of breast: Secondary | ICD-10-CM

## 2024-07-23 NOTE — Progress Notes (Unsigned)
 NEUROLOGY CONSULTATION NOTE  Ann Parks MRN: [id number] DOB: 12/06/1980  Referring provider: Trula Brim, MD Primary care provider: Lamar Bolder, MD  Reason for consult:  abnormal brain MRI  Assessment/Plan:   Assessment and Plan Assessment & Plan Right eye twitch Likely stress-related, no concerning MRI findings for MS or other neurological conditions. - No further workup required.  2. White matter changes on brain MRI  MRI findings nonspecific, not concerning for MS. Possible contribution from migraines and hypertension. - No further workup required.  3.  Hypertension - follow up with PCP  Total time spent on today's visit was 31 minutes dedicated to this patient today, preparing to see patient, examining the patient, ordering tests and/or medications and counseling the patient, documenting clinical information in the EHR or other health record, independently interpreting results and communicating results to the patient/family, discussing treatment and goals, answering patient's questions and coordinating care.     Subjective:   Discussed the use of AI scribe software for clinical note transcription with the patient, who gave verbal consent to proceed.  History of Present Illness Ann Parks is a 43 year old right-handed female with hypertension, migraines and anxiety who presents with a right eye twitch for evaluation of possible multiple sclerosis. She was referred by Dr. Brim for evaluation due to a family history of multiple sclerosis.  She has experienced a twitch in her right eye, which has since almost completely resolved. The symptom onset was during a period of significant stress while she was job hunting, but she has since found employment and feels much better. No other current or past neurological symptoms such as unsteadiness, weakness, or gait disturbances.  Her mother was diagnosed with multiple sclerosis in her late thirties to early forties after  initially being misdiagnosed with fibromyalgia. Her mother received monthly IV treatments, possibly Tysabri, in the late 2000s to early 2010s.  Due to her symptoms of eye twitching and family history of MS, she had an MRI of the brain with and without contrast on 03/01/2024 which revealed frontally predomient few scattered T2/FLAIR hyperintense foci within the cerebral white matter, which was attributed to possibly chronic migraines, early chronic small vessel disease, or sequelae of prior trauma.  Given her family history; she is seeking a second opinion on these results. She is seeking a second opinion on these results.  She has a history of migraines, which she manages with over-the-counter medications as needed.  She also has a history of hypertension since her mid-twenties, which is generally well-controlled with medication, although she noted elevated blood pressure this morning as she had not yet taken her medication.  She experienced panic attacks over ten years ago, which resolved spontaneously.    PAST MEDICAL HISTORY: Past Medical History:  Diagnosis Date   Anxiety    Depression    Hematochezia 11/2010   History of chicken pox    Hypertension    Vaginal yeast infection     PAST SURGICAL HISTORY: No past surgical history on file.  MEDICATIONS: Current Outpatient Medications on File Prior to Visit  Medication Sig Dispense Refill   buPROPion  (WELLBUTRIN  SR) 150 MG 12 hr tablet Take 150 mg by mouth daily.   5   HYDROcodone -acetaminophen  (NORCO/VICODIN) 5-325 MG tablet Take 1 tablet by mouth every 6 (six) hours as needed. 12 tablet 0   ibuprofen  (ADVIL ) 600 MG tablet Take 1 tablet (600 mg total) by mouth every 6 (six) hours as needed for moderate pain or cramping. 40  tablet 1   metoprolol  succinate (TOPROL -XL) 25 MG 24 hr tablet Take 25 mg by mouth daily.  2   Prenatal Vit-Fe Fumarate-FA (PRENATAL MULTIVITAMIN) TABS tablet Take 1 tablet by mouth daily at 12 noon.     No current  facility-administered medications on file prior to visit.    ALLERGIES: Allergies  Allergen Reactions   Penicillins     FAMILY HISTORY: Family History  Problem Relation Age of Onset   Depression Mother    Fibromyalgia Mother    Heart disease Mother    Hypertension Father    Depression Brother    Miscarriages / Stillbirths Maternal Grandmother    Heart disease Maternal Grandfather    Cancer Paternal Grandmother    Obesity Paternal Grandmother    Obesity Paternal Grandfather    Stroke Paternal Grandfather     Objective:  Blood pressure (!) 151/96, pulse 70, height 5' 4 (1.626 m), weight 173 lb (78.5 kg), SpO2 100%. General: No acute distress.  Patient appears well-groomed.   Head:  Normocephalic/atraumatic Eyes:  fundi examined but not visualized Neck: supple, no paraspinal tenderness, full range of motion Heart: regular rate and rhythm Neurological Exam: Mental status: alert and oriented to person, place, and time, speech fluent and not dysarthric, language intact. Cranial nerves: CN I: not tested CN II: pupils equal, round and reactive to light, visual fields intact CN III, IV, VI:  full range of motion, no nystagmus, no ptosis CN V: facial sensation intact. CN VII: upper and lower face symmetric CN VIII: hearing intact CN IX, X: gag intact, uvula midline CN XI: sternocleidomastoid and trapezius muscles intact CN XII: tongue midline Bulk & Tone: normal, no fasciculations. Motor:  muscle strength 5/5 throughout Sensation:  Pinprick and vibratory sensation intact. Deep Tendon Reflexes:  2+ throughout,  toes downgoing.   Finger to nose testing:  Without dysmetria.   Gait:  Normal station and stride.  Romberg negative.    Thank you for allowing me to take part in the care of this patient.  Juliene Dunnings, DO  CC: Royanne Brim, MD

## 2024-07-27 ENCOUNTER — Ambulatory Visit (INDEPENDENT_AMBULATORY_CARE_PROVIDER_SITE_OTHER): Admitting: Neurology

## 2024-07-27 ENCOUNTER — Encounter: Payer: Self-pay | Admitting: Neurology

## 2024-07-27 VITALS — BP 151/96 | HR 70 | Ht 64.0 in | Wt 173.0 lb

## 2024-07-27 DIAGNOSIS — R9082 White matter disease, unspecified: Secondary | ICD-10-CM

## 2024-07-27 NOTE — Patient Instructions (Signed)
 I don't think you have MS.  Findings on brain MRI are very mild and not concerning.  Such findings are commonly seen in people with history of migraines and high blood pressure.  The eye twitching was due to stress.  Vast majority of MS is sporadic and not hereditary.  No further workup is warranted.
